# Patient Record
Sex: Female | Born: 1970 | Race: White | Hispanic: No | Marital: Married | Smoking: Never smoker
Health system: Southern US, Community
[De-identification: ages and names within clinical notes are randomized; demographics above are authoritative.]

## PROBLEM LIST (undated history)

## (undated) DIAGNOSIS — A0472 Enterocolitis due to Clostridium difficile, not specified as recurrent: Secondary | ICD-10-CM

## (undated) DIAGNOSIS — E538 Deficiency of other specified B group vitamins: Secondary | ICD-10-CM

## (undated) DIAGNOSIS — J45909 Unspecified asthma, uncomplicated: Secondary | ICD-10-CM

## (undated) DIAGNOSIS — N3010 Interstitial cystitis (chronic) without hematuria: Secondary | ICD-10-CM

## (undated) DIAGNOSIS — J302 Other seasonal allergic rhinitis: Secondary | ICD-10-CM

## (undated) DIAGNOSIS — K649 Unspecified hemorrhoids: Secondary | ICD-10-CM

## (undated) DIAGNOSIS — D649 Anemia, unspecified: Secondary | ICD-10-CM

## (undated) DIAGNOSIS — K589 Irritable bowel syndrome without diarrhea: Secondary | ICD-10-CM

## (undated) HISTORY — PX: DILATION AND CURETTAGE OF UTERUS: SHX78

## (undated) HISTORY — DX: Interstitial cystitis (chronic) without hematuria: N30.10

## (undated) HISTORY — PX: TUBAL LIGATION: SHX77

## (undated) HISTORY — DX: Unspecified asthma, uncomplicated: J45.909

## (undated) HISTORY — DX: Enterocolitis due to Clostridium difficile, not specified as recurrent: A04.72

## (undated) HISTORY — PX: OTHER SURGICAL HISTORY: SHX169

## (undated) HISTORY — DX: Unspecified hemorrhoids: K64.9

## (undated) HISTORY — DX: Irritable bowel syndrome, unspecified: K58.9

## (undated) HISTORY — DX: Deficiency of other specified B group vitamins: E53.8

---

## 2004-08-03 ENCOUNTER — Other Ambulatory Visit: Admission: RE | Admit: 2004-08-03 | Discharge: 2004-08-03 | Payer: Self-pay | Admitting: Obstetrics and Gynecology

## 2004-08-04 ENCOUNTER — Other Ambulatory Visit: Admission: RE | Admit: 2004-08-04 | Discharge: 2004-08-04 | Payer: Self-pay | Admitting: Obstetrics and Gynecology

## 2004-08-24 ENCOUNTER — Ambulatory Visit: Payer: Self-pay | Admitting: Internal Medicine

## 2005-04-04 ENCOUNTER — Inpatient Hospital Stay (HOSPITAL_COMMUNITY): Admission: AD | Admit: 2005-04-04 | Discharge: 2005-04-07 | Payer: Self-pay | Admitting: Obstetrics and Gynecology

## 2005-04-08 ENCOUNTER — Encounter: Admission: RE | Admit: 2005-04-08 | Discharge: 2005-05-08 | Payer: Self-pay | Admitting: Obstetrics and Gynecology

## 2005-05-09 ENCOUNTER — Encounter: Admission: RE | Admit: 2005-05-09 | Discharge: 2005-06-05 | Payer: Self-pay | Admitting: Obstetrics and Gynecology

## 2006-01-09 ENCOUNTER — Ambulatory Visit: Payer: Self-pay | Admitting: Internal Medicine

## 2006-04-26 ENCOUNTER — Ambulatory Visit: Payer: Self-pay | Admitting: Internal Medicine

## 2006-09-10 ENCOUNTER — Ambulatory Visit: Payer: Self-pay | Admitting: Internal Medicine

## 2006-10-23 ENCOUNTER — Ambulatory Visit: Payer: Self-pay | Admitting: Internal Medicine

## 2006-11-19 DIAGNOSIS — J45909 Unspecified asthma, uncomplicated: Secondary | ICD-10-CM | POA: Insufficient documentation

## 2007-03-07 ENCOUNTER — Telehealth: Payer: Self-pay | Admitting: Internal Medicine

## 2007-08-27 ENCOUNTER — Telehealth: Payer: Self-pay | Admitting: Internal Medicine

## 2007-11-20 ENCOUNTER — Ambulatory Visit (HOSPITAL_COMMUNITY): Admission: RE | Admit: 2007-11-20 | Discharge: 2007-11-20 | Payer: Self-pay | Admitting: Obstetrics and Gynecology

## 2007-11-20 ENCOUNTER — Encounter (INDEPENDENT_AMBULATORY_CARE_PROVIDER_SITE_OTHER): Payer: Self-pay | Admitting: Obstetrics and Gynecology

## 2007-12-22 ENCOUNTER — Ambulatory Visit: Payer: Self-pay | Admitting: Internal Medicine

## 2007-12-22 DIAGNOSIS — H6500 Acute serous otitis media, unspecified ear: Secondary | ICD-10-CM | POA: Insufficient documentation

## 2007-12-22 DIAGNOSIS — H60339 Swimmer's ear, unspecified ear: Secondary | ICD-10-CM | POA: Insufficient documentation

## 2007-12-29 ENCOUNTER — Telehealth: Payer: Self-pay | Admitting: Internal Medicine

## 2008-02-12 ENCOUNTER — Telehealth: Payer: Self-pay | Admitting: *Deleted

## 2008-03-09 ENCOUNTER — Ambulatory Visit: Payer: Self-pay | Admitting: Internal Medicine

## 2008-03-09 DIAGNOSIS — D179 Benign lipomatous neoplasm, unspecified: Secondary | ICD-10-CM | POA: Insufficient documentation

## 2008-03-23 ENCOUNTER — Telehealth: Payer: Self-pay | Admitting: Internal Medicine

## 2008-03-29 ENCOUNTER — Ambulatory Visit: Payer: Self-pay | Admitting: Internal Medicine

## 2008-04-01 ENCOUNTER — Telehealth: Payer: Self-pay | Admitting: Internal Medicine

## 2008-04-05 ENCOUNTER — Ambulatory Visit: Payer: Self-pay | Admitting: Internal Medicine

## 2008-10-22 ENCOUNTER — Ambulatory Visit: Payer: Self-pay | Admitting: Internal Medicine

## 2009-07-14 ENCOUNTER — Encounter (INDEPENDENT_AMBULATORY_CARE_PROVIDER_SITE_OTHER): Payer: Self-pay | Admitting: Obstetrics and Gynecology

## 2009-07-14 ENCOUNTER — Inpatient Hospital Stay (HOSPITAL_COMMUNITY): Admission: RE | Admit: 2009-07-14 | Discharge: 2009-07-16 | Payer: Self-pay | Admitting: Obstetrics and Gynecology

## 2009-07-17 ENCOUNTER — Encounter: Admission: RE | Admit: 2009-07-17 | Discharge: 2009-08-16 | Payer: Self-pay | Admitting: Obstetrics and Gynecology

## 2009-08-17 ENCOUNTER — Encounter: Admission: RE | Admit: 2009-08-17 | Discharge: 2009-09-16 | Payer: Self-pay | Admitting: Obstetrics and Gynecology

## 2009-08-30 ENCOUNTER — Encounter: Payer: Self-pay | Admitting: Cardiovascular Disease

## 2009-09-17 ENCOUNTER — Encounter: Admission: RE | Admit: 2009-09-17 | Discharge: 2009-10-10 | Payer: Self-pay | Admitting: Obstetrics and Gynecology

## 2009-11-14 ENCOUNTER — Telehealth: Payer: Self-pay | Admitting: Internal Medicine

## 2009-11-28 ENCOUNTER — Ambulatory Visit: Payer: Self-pay | Admitting: Internal Medicine

## 2009-11-29 ENCOUNTER — Encounter: Payer: Self-pay | Admitting: Internal Medicine

## 2009-12-05 ENCOUNTER — Telehealth: Payer: Self-pay | Admitting: Internal Medicine

## 2009-12-07 ENCOUNTER — Telehealth: Payer: Self-pay | Admitting: Internal Medicine

## 2009-12-12 ENCOUNTER — Ambulatory Visit: Payer: Self-pay | Admitting: Internal Medicine

## 2009-12-12 ENCOUNTER — Encounter: Payer: Self-pay | Admitting: Gastroenterology

## 2009-12-12 DIAGNOSIS — R197 Diarrhea, unspecified: Secondary | ICD-10-CM | POA: Insufficient documentation

## 2009-12-12 DIAGNOSIS — Z8719 Personal history of other diseases of the digestive system: Secondary | ICD-10-CM | POA: Insufficient documentation

## 2009-12-12 LAB — CONVERTED CEMR LAB
Basophils Absolute: 0 10*3/uL (ref 0.0–0.1)
Basophils Relative: 0.8 % (ref 0.0–3.0)
Eosinophils Absolute: 0.1 10*3/uL (ref 0.0–0.7)
Eosinophils Relative: 2.3 % (ref 0.0–5.0)
HCT: 42.2 % (ref 36.0–46.0)
Hemoglobin: 14.5 g/dL (ref 12.0–15.0)
Lymphocytes Relative: 37 % (ref 12.0–46.0)
Lymphs Abs: 1.7 10*3/uL (ref 0.7–4.0)
MCHC: 34.3 g/dL (ref 30.0–36.0)
MCV: 89.9 fL (ref 78.0–100.0)
Monocytes Absolute: 0.4 10*3/uL (ref 0.1–1.0)
Monocytes Relative: 8.1 % (ref 3.0–12.0)
Neutro Abs: 2.4 10*3/uL (ref 1.4–7.7)
Neutrophils Relative %: 51.8 % (ref 43.0–77.0)
Platelets: 199 10*3/uL (ref 150.0–400.0)
RBC: 4.69 M/uL (ref 3.87–5.11)
RDW: 12.7 % (ref 11.5–14.6)
WBC: 4.6 10*3/uL (ref 4.5–10.5)

## 2009-12-14 ENCOUNTER — Telehealth: Payer: Self-pay | Admitting: Nurse Practitioner

## 2009-12-15 ENCOUNTER — Encounter (INDEPENDENT_AMBULATORY_CARE_PROVIDER_SITE_OTHER): Payer: Self-pay | Admitting: *Deleted

## 2009-12-15 ENCOUNTER — Ambulatory Visit: Payer: Self-pay | Admitting: Internal Medicine

## 2009-12-16 ENCOUNTER — Encounter: Payer: Self-pay | Admitting: Internal Medicine

## 2009-12-18 ENCOUNTER — Telehealth: Payer: Self-pay | Admitting: Gastroenterology

## 2009-12-19 ENCOUNTER — Encounter: Payer: Self-pay | Admitting: Internal Medicine

## 2009-12-22 ENCOUNTER — Telehealth: Payer: Self-pay | Admitting: Internal Medicine

## 2009-12-27 ENCOUNTER — Telehealth: Payer: Self-pay | Admitting: Internal Medicine

## 2009-12-27 DIAGNOSIS — K589 Irritable bowel syndrome without diarrhea: Secondary | ICD-10-CM | POA: Insufficient documentation

## 2010-02-15 ENCOUNTER — Ambulatory Visit (HOSPITAL_COMMUNITY)
Admission: RE | Admit: 2010-02-15 | Discharge: 2010-02-15 | Payer: Self-pay | Source: Home / Self Care | Admitting: Internal Medicine

## 2010-02-15 ENCOUNTER — Ambulatory Visit: Payer: Self-pay | Admitting: Internal Medicine

## 2010-02-15 DIAGNOSIS — K3189 Other diseases of stomach and duodenum: Secondary | ICD-10-CM | POA: Insufficient documentation

## 2010-02-15 DIAGNOSIS — R1013 Epigastric pain: Secondary | ICD-10-CM

## 2010-02-15 LAB — CONVERTED CEMR LAB: Tissue Transglutaminase Ab, IgA: 2.5 units (ref <20)

## 2010-03-06 ENCOUNTER — Telehealth: Payer: Self-pay | Admitting: Internal Medicine

## 2010-05-18 ENCOUNTER — Other Ambulatory Visit: Payer: Self-pay | Admitting: Dermatology

## 2010-05-29 ENCOUNTER — Telehealth: Payer: Self-pay | Admitting: Internal Medicine

## 2010-05-30 NOTE — Miscellaneous (Signed)
Summary: Bentyl, Flagyl rx  Clinical Lists Changes  Medications: Added new medication of FLAGYL 250 MG TABS (METRONIDAZOLE) take three daily by mouth for 10 days - Signed Rx of FLAGYL 250 MG TABS (METRONIDAZOLE) take three daily by mouth for 10 days;  #30 x 0;  Signed;  Entered by: Oda Cogan RN;  Authorized by: Hart Carwin MD;  Method used: Electronically to [id number]*, [address], [place], [place], [place]  [address], Ph: [phone] or [phone], Fax: [phone]    Prescriptions: FLAGYL 250 MG TABS (METRONIDAZOLE) take three daily by mouth for 10 days  #30 x 0   Entered by:   Oda Cogan RN   Authorized by:   Hart Carwin MD   Signed by:   Oda Cogan RN on 12/15/2009   Method used:   Electronically to        [facility] [phone]* (retail)       [address]       [place], [place]  [address]       Ph: [phone] or [phone]       Fax: [phone]   RxID:   [id number]

## 2010-05-30 NOTE — Assessment & Plan Note (Signed)
Summary: follow up colon/sheri   History of Present Illness Visit Type: Follow-up Visit Primary GI MD: [name] MD Primary Provider: Birdie Sons, MD Chief Complaint: Patient not sure that c-diff is gone, she has some good days and bad days History of Present Illness:   This is an 40 year old white female with irritable bowel syndrome/diarrhea. She was diagnosed with pseudomembranous colitis while visiting [place] this past summer. A colonoscopy in August 2011 did not show any evidence of colitis. Her brother has Crohn's disease. She still has upper abdominal discomfort ;especially left upper quadrant and she also continues to have diarrhea. She denies lactose intolerance. Her 56 month old daughter has failure to thrive and has been put on Prilosec 20 mg daily for regurgitation of food due to GERD   GI Review of Systems    Reports abdominal pain.     Location of  Abdominal pain: upper abdomen.    Denies acid reflux, belching, bloating, chest pain, dysphagia with liquids, dysphagia with solids, heartburn, loss of appetite, nausea, vomiting, vomiting blood, weight loss, and  weight gain.      Reports diarrhea.     Denies anal fissure, black tarry stools, change in bowel habit, constipation, diverticulosis, fecal incontinence, heme positive stool, hemorrhoids, irritable bowel syndrome, jaundice, light color stool, liver problems, rectal bleeding, and  rectal pain.    Current Medications (verified): 1)  Zyrtec Allergy 10 Mg Tabs (Cetirizine Hcl) .... Once Daily  Allergies (verified): No Known Drug Allergies  Past History:  Past Medical History: Reviewed history from 12/12/2009 and no changes required. allergy induced asthma Asthma Dil. and curretage 3 weeks ago for Uterine polyps Arrhythmia Irritable Bowel Syndrome Interstitial Cystitis  Past Surgical History: Reviewed history from 12/27/2009 and no changes required. D&C 3 weeks ago C- section x 2 Resection of endometrial  polyp bilateral tubal sterilization  Family History: Reviewed history from 12/27/2009 and no changes required. Crohns---brother bladder CA, father Family History of Irritable Bowel Syndrome: No FH of Colon Cancer:  Social History: Reviewed history from 12/12/2009 and no changes required. Occupation: Unemployed Patient has never smoked.  Alcohol Use - yes Daily Caffeine Use Illicit Drug Use - no  Review of Systems       The patient complains of allergy/sinus.  The patient denies anemia, anxiety-new, arthritis/joint pain, back pain, blood in urine, breast changes/lumps, change in vision, confusion, cough, coughing up blood, depression-new, fainting, fatigue, fever, headaches-new, hearing problems, heart murmur, heart rhythm changes, itching, menstrual pain, muscle pains/cramps, night sweats, nosebleeds, pregnancy symptoms, shortness of breath, skin rash, sleeping problems, sore throat, swelling of feet/legs, swollen lymph glands, thirst - excessive , urination - excessive , urination changes/pain, urine leakage, vision changes, and voice change.         Pertinent positive and negative review of systems were noted in the above HPI. All other ROS was otherwise negative.   Vital Signs:  Patient profile:   40 year old female Height:      64 inches Weight:      141.50 pounds BMI:     24.38 Pulse rate:   72 / minute Pulse rhythm:   regular BP sitting:   110 / 72  (left arm) Cuff size:   regular  Vitals Entered By: June McMurray CMA Duncan Dull) (February 15, 2010 10:11 AM)  Physical Exam  General:  Well developed, well nourished, no acute distress. Eyes:  PERRLA, no icterus. Lungs:  Clear throughout to auscultation. Heart:  Regular rate and rhythm; no murmurs,  rubs,  or bruits. Abdomen:  soft abdomen there is no active bowel sounds. No distention. Tenderness in epigastrium and left costal margin. Extremities:  No clubbing, cyanosis, edema or deformities noted. Skin:  Intact without  significant lesions or rashes. Psych:  Alert and cooperative. Normal mood and affect.   Impression & Recommendations:  Problem # 1:  IRRITABLE BOWEL SYNDROME (ICD-564.1) Patient has irritable bowel syndrome. We need to rule out pseudomembranous colitis, gastritis, biliary dysfunction. We will obtain stool tests for C. difficile by PCR. We will also obtain a sprue profile and IgA levels and also schedule patient for an upper abdominal ultrasound. We will start her on Bentyl 20 mg q.a.m. and give her samples of AcipHex 20 mg a day for the next few days on trial basis.  Problem # 2:  Hx of CLOSTRIDIUM DIFFICILE COLITIS, HX OF (ICD-V12.79) We will obtain stool for C. difficile toxin by PCR.  Orders: T-Tissue Transglutamase Ab IgA [phone]) T- * Misc. Laboratory test [phone]) T-Sprue Panel (Celiac Disease Aby Eval) (83516x3/[id number]) T- * Misc. Laboratory test [phone]) TLB-IgA (Immunoglobulin A) ([id number])  Other Orders: Ultrasound Abdomen (UAS)  Patient Instructions: 1)  stool test for C. difficile toxin by PCR. 2)  Sprue profile including tissue transglutaminase and IgA. 3)  Bentyl 20 mg p.o. q.a.m. 4)  Samples of AcipHex 20 mg q.a.m. x 10 days 5)  The medication list was reviewed and reconciled.  All changed / newly prescribed medications were explained.  A complete medication list was provided to the patient / caregiver. 6)  Br Bruce [name] Prescriptions: BENTYL 20 MG TABS (DICYCLOMINE HCL) Take 1 tablet by mouth every morning  #30 x 1   Entered by:   Lamona Curl CMA (AAMA)   Authorized by:   Hart Carwin MD   Signed by:   Lamona Curl CMA (AAMA) on 02/15/2010   Method used:   Electronically to        [facility] [phone]* (retail)       [address]       [place], [place]  [address]       Ph: [phone] or [phone]       Fax: [phone]   RxID:   [id number]

## 2010-05-30 NOTE — Progress Notes (Signed)
Summary: diarrhea x 2 weeks.  Phone Note Call from Patient   Caller: Patient Call For: Birdie Sons MD Summary of Call: [phone] Pt is out of town, and is complaining of diarrhea x 2 weeks while traveling.  Advised to see Urgent Care for possible cultures and tesing while on extended leave out of town. Initial call taken by: Lynann Beaver CMA,  November 14, 2009 9:23 AM

## 2010-05-30 NOTE — Procedures (Signed)
Summary: Colonoscopy  Patient: [name] Note: All result statuses are Final unless otherwise noted.  Tests: (1) Colonoscopy (COL)   COL Colonoscopy           DONE     [facility]     [address]     [place], [place]  [address]           COLONOSCOPY PROCEDURE REPORT           PATIENT:  Kari Stephens, Kari Stephens  MR#:  [id number]     BIRTHDATE:  Jan 24, 1971, 39 yrs. old  GENDER:  female     ENDOSCOPIST:  Hedwig Morton. Juanda Chance, MD     REF. BY:  Birdie Sons, M.D.     PROCEDURE DATE:  12/15/2009     PROCEDURE:  Colonoscopy 19147     ASA CLASS:  Class I     INDICATIONS:  unexplained diarrhea hx IBS     C.Diff diagnosed in [place], treated with     Flagylx10days without improvement     brother with Crohn's disease     MEDICATIONS:   Versed 10 mg, Fentanyl 100 mcg           DESCRIPTION OF PROCEDURE:   After the risks benefits and     alternatives of the procedure were thoroughly explained, informed     consent was obtained.  Digital rectal exam was performed and     revealed no rectal masses.   The LB [id number] endoscope     was introduced through the anus and advanced to the terminal ileum     which was intubated for a short distance, without limitations.     The quality of the prep was good, using MiraLax.  The instrument     was then slowly withdrawn as the colon was fully examined.     <<PROCEDUREIMAGES>>           FINDINGS:  No polyps or cancers were seen. normal appearing colon     mucosa, no aphthous ulcers, ileocecal valve normal, random     biopsies to r/o microscopic colitis Random biopsies were obtained     and sent to pathology (see image1, image2, and image3).  The     terminal ileum appeared normal. With standard forceps, biopsy was     obtained and sent to pathology.  normal rectum (see image4).     Retroflexed views in the rectum revealed no abnormalities.    The     scope was then withdrawn from the patient and the procedure     completed.          COMPLICATIONS:  None     ENDOSCOPIC IMPRESSION:     1) No polyps or cancers     2) Normal terminal ileum     3) Normal rectum     4) Normal colonoscopy     s/p random biopsies of the colon and of the TI     liquid stool odor similar to C.Diff     RECOMMENDATIONS:     1) Await biopsy results     would treat empirically for 10 more days with Flagyl because of     the stool odor, also continue probiotics, 250 mg po tid x 10 d,     #30, no refill     Bentyl 10 mg po bid.# 20, 1 refill     REPEAT EXAM:  In 12 year(s) for.  age 31  ______________________________     Hedwig Morton. Juanda Chance, MD           CC:           n.     eSIGNED:   Hedwig Morton. Finbar Nippert at 12/15/2009 05:25 PM           Dwain Sarna, [id number]  Note: An exclamation mark (!) indicates a result that was not dispersed into the flowsheet. Document Creation Date: 12/15/2009 5:26 PM _______________________________________________________________________  (1) Order result status: Final Collection or observation date-time: 12/15/2009 17:10 Requested date-time:  Receipt date-time:  Reported date-time:  Referring Physician:   Ordering Physician: [name] [phone]) Specimen Source:  Source: Launa Grill Order Number: [id number] Lab site:

## 2010-05-30 NOTE — Progress Notes (Signed)
Summary: was told to call Dr Cato Mulligan  Phone Note Call from Patient Call back at [phone]   Caller: Patient---live call Summary of Call: Dr Cato Mulligan told patient to call if not better. She isn't better. please advise. was seen a week ago. Initial call taken by: Warnell Forester,  December 05, 2009 8:08 AM  Follow-up for Phone Call        lets have her see Gi likely needs colonoscopy GI appt this week---ok to see PA first Follow-up by: Birdie Sons MD,  December 05, 2009 10:41 AM  Additional Follow-up for Phone Call Additional follow up Details #1::        Patient notified. Will await when and where of appt.  Requests Dr Juanda Chance if possible, but aware Dr Cato Mulligan would like her seen this week.  Order in process. Additional Follow-up by: Gladis Riffle, RN,  December 05, 2009 12:25 PM

## 2010-05-30 NOTE — Letter (Signed)
Summary: [facility] Instructions  [facility]  [address] [place], [place] [address]   Phone: [phone]  Fax: [phone]       Kari Stephens    [birth year]    MRN: [id number]       Procedure Day /Date:THURSDAY 12/15/2009     Arrival Time: 3PM     Procedure Time:4PM     Location of Procedure:                    x   Endoscopy Center (4th Floor)    PREPARATION FOR COLONOSCOPY WITH MIRALAX  Starting 5 days prior to your procedure 8/13/2011do not eat nuts, seeds, popcorn, corn, beans, peas,  salads, or any raw vegetables.  Do not take any fiber supplements (e.g. Metamucil, Citrucel, and Benefiber). ____________________________________________________________________________________________________   THE DAY BEFORE YOUR PROCEDURE         DATE:12/14/2009 DAY: WEDNESDAY  1   Drink clear liquids the entire day-NO SOLID FOOD  2   Do not drink anything colored red or purple.  Avoid juices with pulp.  No orange juice.  3   Drink at least 64 oz. (8 glasses) of fluid/clear liquids during the day to prevent dehydration and help the prep work efficiently.  CLEAR LIQUIDS INCLUDE: Water Jello Ice Popsicles Tea (sugar ok, no milk/cream) Powdered fruit flavored drinks Coffee (sugar ok, no milk/cream) Gatorade Juice: apple, white grape, white cranberry  Lemonade Clear bullion, consomm, broth Carbonated beverages (any kind) Strained chicken noodle soup Hard Candy  4   Mix the entire bottle of Miralax with 64 oz. of Gatorade/Powerade in the morning and put in the refrigerator to chill.  5   At 3:00 pm take 2 Dulcolax/Bisacodyl tablets.  6   At 4:30 pm take one Reglan/Metoclopramide tablet.  7  Starting at 5:00 pm drink one 8 oz glass of the Miralax mixture every 15-20 minutes until you have finished drinking the entire 64 oz.  You should finish drinking prep around 7:30 or 8:00 pm.  8   If you are nauseated, you may take the 2nd Reglan/Metoclopramide tablet at  6:30 pm.        9    At 8:00 pm take 2 more DULCOLAX/Bisacodyl tablets.     THE DAY OF YOUR PROCEDURE      DATE:  12/15/2009 DAY: Lenor Coffin  You may drink clear liquids until 2PM (2 HOURS BEFORE PROCEDURE).   MEDICATION INSTRUCTIONS  Unless otherwise instructed, you should take regular prescription medications with a small sip of water as early as possible the morning of your procedure.           OTHER INSTRUCTIONS  You will need a responsible adult at least 39 years of age to accompany you and drive you home.   This person must remain in the waiting room during your procedure.  Wear loose fitting clothing that is easily removed.  Leave jewelry and other valuables at home.  However, you may wish to bring a book to read or an iPod/MP3 player to listen to music as you wait for your procedure to start.  Remove all body piercing jewelry and leave at home.  Total time from sign-in until discharge is approximately 2-3 hours.  You should go home directly after your procedure and rest.  You can resume normal activities the day after your procedure.  The day of your procedure you should not:   Drive   Make legal decisions   Operate machinery  Drink alcohol   Return to work  You will receive specific instructions about eating, activities and medications before you leave.   The above instructions have been reviewed and explained to me by   _______________________    I fully understand and can verbalize these instructions _____________________________ Date _______

## 2010-05-30 NOTE — Assessment & Plan Note (Signed)
Summary: Diarrhea/dfs   History of Present Illness Visit Type: Initial Visit Primary GI MD: [name] MD Primary Provider: Birdie Sons, MD Chief Complaint: chronic diarrhea, sour stomach History of Present Illness:   Patient is a 40 year old female, new to GI, who presents with diarrhea. Mid March patient had C-section. Toward end of pregnancy she had some mild diarrhea but attributes that to hormones and a lot going on at the time.  She remembers discussing problem of excessive flatus with GYN at post-op visit.  She did have antibiotics at time of C-section in March but no other antibiotics that she can recall.  Patient has  chronic history of intermittent loose stools, mainly in am. Told at some point in time she may have IBS. No formal GI workup. In July things worsened significantly. She was in [place] at the time, went to urgent care and diagnosed with C-Difficile. Completed 10 days of Flagyl almost two weeks ago but BMs still not back to baseline.   She is taking probiotics. No rectal bleeding except for hemorrhoidal bleeding during pregnancy. Currently no fevers. Weight has dropped but eating cautiously because scared of diarrhea. No nocturnal diarrhea.No skin rashes, no joint pain.  Her brother has terrible Crohn 's disease.There are a lot "weird stomach issues in the family"   GI Review of Systems    Reports belching, bloating, and  nausea.      Denies abdominal pain, chest pain, dysphagia with liquids, dysphagia with solids, heartburn, loss of appetite, vomiting, vomiting blood, weight loss, and  weight gain.      Reports change in bowel habits, diarrhea, irritable bowel syndrome, and  rectal pain.     Denies anal fissure, black tarry stools, constipation, diverticulosis, fecal incontinence, heme positive stool, hemorrhoids, jaundice, light color stool, liver problems, and  rectal bleeding. Preventive Screening-Counseling & Management  Alcohol-Tobacco     Smoking Status:  never      Drug Use:  no.      Current Medications (verified): 1)  Zyrtec Allergy 10 Mg Tabs (Cetirizine Hcl) .... Once Daily 2)  Align  Caps (Probiotic Product) .... Once Daily  Allergies (verified): No Known Drug Allergies  Past History:  Past Medical History: allergy induced asthma Asthma Dil. and curretage 3 weeks ago for Uterine polyps Arrhythmia Irritable Bowel Syndrome Interstitial Cystitis  Past Surgical History: D& C 3 weeks ago C- section x2  Family History: Crohns---brother bladder CA, father Family History of Irritable Bowel Syndrome:  Social History: Occupation: Unemployed Patient has never smoked.  Alcohol Use - yes Daily Caffeine Use Illicit Drug Use - no  Review of Systems       The patient complains of allergy/sinus, fatigue, heart rhythm changes, and skin rash.  The patient denies anemia, anxiety-new, arthritis/joint pain, back pain, blood in urine, breast changes/lumps, change in vision, confusion, cough, coughing up blood, depression-new, fainting, fever, headaches-new, hearing problems, heart murmur, itching, menstrual pain, muscle pains/cramps, night sweats, nosebleeds, pregnancy symptoms, shortness of breath, sleeping problems, sore throat, swelling of feet/legs, swollen lymph glands, thirst - excessive, urination - excessive, urination changes/pain, urine leakage, vision changes, and voice change.    Vital Signs:  Patient profile:   40 year old female Height:      64 inches Weight:      141.50 pounds BMI:     24.38 Pulse rate:   68 / minute Pulse rhythm:   regular BP sitting:   102 / 70  (left arm) Cuff size:  regular  Vitals Entered By: June McMurray CMA Duncan Dull) (December 12, 2009 3:38 PM)  Physical Exam  General:  Well developed, well nourished, no acute distress. Head:  Normocephalic and atraumatic. Eyes:  Conjunctiva pink, no icterus.  Mouth:  No oral lesions. Tongue moist.  Neck:  no obvious masses  Lungs:  Clear throughout  to auscultation. Heart:  Regular rate and rhythm; no murmurs, rubs,  or bruits. Abdomen:  Abdomen soft, nontender, nondistended. No obvious masses or hepatomegaly.Normal bowel sounds.  Msk:  Symmetrical with no gross deformities. Normal posture. Extremities:  No palmar erythema, no edema.  Neurologic:  Alert and  oriented x4;  grossly normal neurologically. Skin:  Intact without significant lesions or rashes. Cervical Nodes:  No significant cervical adenopathy. Psych:  Alert and cooperative. Normal mood and affect.   Impression & Recommendations:  Problem # 1:  DIARRHEA (ICD-787.91) Assessment New C-Difficile in July, only risk factor as far as I can tell is antibiotic use but that was back in mid March. Completed Flagyl almost two weeks ago. Bowels still not back to baseline.  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 It is possible she has underlying IBD, which is risk factor for C-Diff, but low liklihood. Brother with Crohn's disease. For further evaluation patient will be schedule for a colonoscopy with biopsies/polypectomy (if indicated).  The risks and benefits of the procedure, as well as alternatives were discussed with the patient and she agrees to proceed.  Orders: TLB-CBC Platelet - w/Differential ([id number]) Colonoscopy (Colon)  Patient Instructions: 1)  Copy sent to : Birdie Sons, MD 2)  Your Colonoscopy is scheduled for 12/15/2009 at 4pm 3)  You can pick up your Miralax prep from your pharmacy today 4)  The medication list was reviewed and reconciled.  All changed / newly prescribed medications were explained.  A complete medication list was provided to the patient / caregiver. Prescriptions: DULCOLAX 5 MG  TBEC (BISACODYL) Day before procedure take 2 at 3pm and 2 at 8pm.  #4 x 0   Entered by:   Merri Ray CMA (AAMA)   Authorized by:   Willette Cluster NP   Signed by:   Merri Ray CMA (AAMA) on 12/12/2009   Method used:   Electronically to         [facility] [phone]* (retail)       [address]       [place], [place]  [address]       Ph: [phone] or [phone]       Fax: [phone]   RxID:   [id number] REGLAN 10 MG  TABS (METOCLOPRAMIDE HCL) As per prep instructions.  #2 x 0   Entered by:   Merri Ray CMA (AAMA)   Authorized by:   Willette Cluster NP   Signed by:   Merri Ray CMA (AAMA) on 12/12/2009   Method used:   Electronically to        [facility] [phone]* (retail)       [address]       [place], [place]  [address]       Ph: [phone] or [phone]       Fax: [phone]   RxID:   [id number] MIRALAX   POWD (POLYETHYLENE GLYCOL 3350) As per prep  instructions.  #255gm x 0   Entered by:   Merri Ray CMA (AAMA)  Authorized by:   Willette Cluster NP   Signed by:   Merri Ray CMA (AAMA) on 12/12/2009   Method used:   Electronically to        [facility] [phone]* (retail)       [address]       [place], [place]  [address]       Ph: [phone] or [phone]       Fax: [phone]   RxID:   [id number]

## 2010-05-30 NOTE — Progress Notes (Signed)
Summary: ON CALL NOTE                             [facility]                                    ON-CALL NOTE      SHINE, SCROGHAM                     MRN:          [id number]   DATE:12/18/2009                            DOB:          1970/10/31         Ms. Sanko called complaining of back pain.  She had a colonoscopy last   week.  Pain is worse when she turns and twists.  She has not had a   problem with back pain in the past.  She denies abdominal pain.      I explained to her that this sounds like musculoskeletal pain and then   she can take ibuprofen or any other nonsteroidal as needed for the pain.            Barbette Hair. Arlyce Dice, MD,FACG            RDK/MedQ  DD: 12/18/2009  DT: 12/19/2009  Job #: [id number]      cc:   Hedwig Morton. Juanda Chance, MD

## 2010-05-30 NOTE — Progress Notes (Signed)
Summary: Appt this wk PA first ok  Phone Note From Other Clinic   Caller: Terri @ Dr Cato Mulligan Call For: Dr Juanda Chance ( Doc Of Day) Reason for Call: Schedule Patient Appt Summary of Call: Clostridium Dificile Colilitis evaluate & treat. Would like patient seen this week PA first ok. Initial call taken by: Leanor Kail [facility],  December 07, 2009 8:09 AM  Follow-up for Phone Call        LM for Terri to call re appt.  Lupita Leash Surface RN  December 07, 2009 8:58 AM  talked with pt.  Appt sch for pt to see Rozetta Nunnery NP on 12/12/09, Dr. Juanda Chance is supervising physician on this day.  Pt states it is OK to wait until Monday. Dr. Marliss Coots office notified. Follow-up by: Ashok Cordia RN,  December 07, 2009 9:23 AM

## 2010-05-30 NOTE — Progress Notes (Signed)
Summary: results request  Phone Note Call from Patient Call back at [phone]   Caller: Patient Call For: Willette Cluster Reason for Call: Talk to Nurse Summary of Call: would like lab results Initial call taken by: Vallarie Mare,  December 14, 2009 3:00 PM  Follow-up for Phone Call        Gave pt her lab results, normal.   Follow-up by: Joselyn Glassman,  December 15, 2009 9:18 AM

## 2010-05-30 NOTE — Assessment & Plan Note (Signed)
Summary: F/U FROM C-DIFF DX // RS   Vital Signs:  Patient profile:   40 year old female Weight:      145 pounds Temp:     98.8 degrees F oral Pulse rate:   64 / minute Pulse rhythm:   regular Resp:     12 per minute BP sitting:   114 / 76  (left arm) Cuff size:   regular  Vitals Entered By: Gladis Riffle, RN (November 28, 2009 11:16 AM) CC: FU, was diagnosed with C-diff in NH and completed 10 day course of metronidazole --continues "stomach issues" Is Patient Diabetic? No   CC:  FU and was diagnosed with C-diff in NH and completed 10 day course of metronidazole --continues "stomach issues".  History of Present Illness: c section 4 months ago had a chronic upset stomach developed diarrhea was out of town, went to [facility]--dx with C Diff she recently had her first period after pregnancy  Also daughter 9 months old had C diff pt denies recent ABX use (had abx at time of C section)  Fhx of Crohns disease (brother)  All other systems reviewed and were negative   Preventive Screening-Counseling & Management  Alcohol-Tobacco     Smoking Status: never     Passive Smoke Exposure: no  Allergies (verified): No Known Drug Allergies  Past History:  Past Medical History: Last updated: 12/22/2007 allergy induced asthma Asthma Dil. and curretage 3 weeks ago for Uterine polyps  Family History: Last updated: 11/28/2009 Crohns---brother bladder CA, father  Risk Factors: Exercise: yes (12/22/2007)  Risk Factors: Smoking Status: never (11/28/2009) Passive Smoke Exposure: no (11/28/2009)  Family History: Crohns---brother bladder CA, father  Review of Systems       All other systems reviewed and were negative   Physical Exam  General:  Well-developed,well-nourished,in no acute distress; alert,appropriate and cooperative throughout examination Head:  normocephalic and atraumatic.   Eyes:  pupils equal and pupils round.   Ears:  R ear normal and L ear normal.   Neck:  No  deformities, masses, or tenderness noted. Lungs:  Normal respiratory effort, chest expands symmetrically. Lungs are clear to auscultation, no crackles or wheezes. Heart:  normal rate and regular rhythm.   Abdomen:  soft and non-tender.   Msk:  No deformity or scoliosis noted of thoracic or lumbar spine.   Neurologic:  cranial nerves II-XII intact and gait normal.     Impression & Recommendations:  Problem # 1:  CLOSTRIDIUM DIFFICILE COLITIS (ICD-008.45)  resolved completed ABX she has occasional loose, recheck c diff toxin if negative will consider probiotic  Orders: T-Clostridium difficile Toxin A/B ([id number])  Complete Medication List: 1)  Zyrtec Allergy 10 Mg Tabs (Cetirizine hcl) .... Once daily 2)  Imiquimod 5 % Crea (Imiquimod) .... Apply nightly x 6 weeks 3)  Align Caps (Probiotic product) .... Take per pkg instructions x 2 weeks

## 2010-05-30 NOTE — Letter (Signed)
Summary: Appt Reminder 2  [facility]  [address] [place], [place] [address]   Phone: [phone]  Fax: [phone]        December 16, 2009 MRN: [id number]    [facility] [address] [place], [place]  [address]    Dear Ms. Dreibelbis,   You have a return appointment with Dr. Juanda Chance on 01/06/10 at 8:45 am.  Please remember to bring a complete list of the medicines you are taking, your insurance card and your co-pay.  If you have to cancel or reschedule this appointment, please call before 5:00 pm the evening before to avoid a cancellation fee.  If you have any questions or concerns, please call [phone].    Sincerely,    Darcey Nora RN, CGRN  Appended Document: Appt Reminder 2 letter mailed to patient's home

## 2010-05-30 NOTE — Progress Notes (Signed)
Summary: Question about her labs  Phone Note Call from Patient Call back at [phone]   Call For: Dr Juanda Chance Reason for Call: Talk to Nurse Summary of Call: Question about her labs.   Initial call taken by: Leanor Kail [facility],  December 27, 2009 1:39 PM  Follow-up for Phone Call        all questions answered.  Patient  wanted to know if she had been tested for celiac.  Patient  advised she has not been screened for celiac best I can tell. Follow-up by: Darcey Nora RN, CGRN,  December 27, 2009 1:58 PM

## 2010-05-30 NOTE — Progress Notes (Signed)
Summary: discuss results  Phone Note Call from Patient Call back at [phone]   Caller: Patient Call For: Dr. Juanda Chance Reason for Call: Talk to Nurse Summary of Call: questions regarding [place] results Initial call taken by: Vallarie Mare,  March 06, 2010 9:33 AM  Follow-up for Phone Call        Patient called to report that last night when showering she noticed an indention on her right side at the end of her c-section incision. The area  feels like a "marble". Area is not painful. she wants to be sure the ultrasound looked at this area and it is not adhesions that are causing her IBS symptoms.  Please, advise. Follow-up by: Jesse Fall RN,  March 06, 2010 10:02 AM  Additional Follow-up for Phone Call Additional follow up Details #1::        The ultrasound looked onlu at the upper abdomen, it did not  include pelvic structures  such as the site of her C-section. I would like to look at the "nodule" at the end of her scar, it is likely to be a scar tissue within the abdominal wall. Before oredering a separate pelvis ultrasound i would like to  uunderstand her concern. It is not likely to be involving her colon.  Additional Follow-up by: Hart Carwin MD,  March 06, 2010 9:34 PM     Appended Document: discuss results I am a little confused about your note. Do you want me to schedule an appointment for pt? She does not have a f/u appointment scheduled. Jesse Fall, RN 03/07/10 9:22  Appended Document: discuss results I have spoken to the pt at length, all concerns answered. No need for an appointment.

## 2010-05-30 NOTE — Progress Notes (Signed)
Summary: Test results  Phone Note Call from Patient Call back at [phone]   Caller: Patient Call For: Dr Juanda Chance Reason for Call: Talk to Nurse Summary of Call: Would like test results.   Initial call taken by: Misty Stanley,  December 22, 2009 11:23 AM  Follow-up for Phone Call        no answer and no machine at the home number.  Attempted to reach her at her work number and was told she no longer works there.  I will continue to try and reach the patient. Darcey Nora RN, [facility]  December 22, 2009 11:42 AM  reviewed results with the patient, she is feeling much better. She will call back for further problems. Follow-up by: Darcey Nora RN, CGRN,  December 22, 2009 1:53 PM

## 2010-05-30 NOTE — Miscellaneous (Signed)
Summary: bentyll rx  Clinical Lists Changes  Medications: Added new medication of BENTYL 10 MG  CAPS (DICYCLOMINE HCL) one twice daily by mouth - Signed Rx of BENTYL 10 MG  CAPS (DICYCLOMINE HCL) one twice daily by mouth;  #20 x 1;  Signed;  Entered by: Oda Cogan RN;  Authorized by: Hart Carwin MD;  Method used: Electronically to [id number]*, [address] [place], [place], [place]  [address], Ph: [phone] or [phone], Fax: [phone]    Prescriptions: BENTYL 10 MG  CAPS (DICYCLOMINE HCL) one twice daily by mouth  #20 x 1   Entered by:   Oda Cogan RN   Authorized by:   Hart Carwin MD   Signed by:   Oda Cogan RN on 12/15/2009   Method used:   Electronically to        [facility] [address] [phone]* (retail)       [address]       [place], [place]  [address]       Ph: [phone] or [phone]       Fax: [phone]   RxID:   [id number]

## 2010-05-30 NOTE — Letter (Signed)
Summary: Patient Notice- Colon Biospy Results  [facility]  [address] [place], [place] [address]   Phone: [phone]  Fax: [phone]        December 19, 2009 MRN: [id number]    [facility] [address] [place], [place]  [address]    Dear Ms. Romberger,  I am pleased to inform you that the biopsies taken during your recent colonoscopy did not show any evidence of cancer upon pathologic examination.The biopsies from Your terminal ileum and from Your colon all show normal tissue without any evidence of colitis.  Additional information/recommendations:  __No further action is needed at this time.  Please follow-up with      your primary care physician for your other healthcare needs.  __Please call [phone] to schedule a return visit to review      your condition.  _x_Continue with the treatment plan as outlined on the day of your      exam.  _x_You should have a repeat colonoscopy examination for this problem at age 54            Please call us if you are having persistent problems or have questions about your condition that have not been fully answered at this time.  Sincerely,  Hart Carwin MD   This letter has been electronically signed by your physician.  Appended Document: Patient Notice- Colon Biospy Results letter mailed

## 2010-05-31 ENCOUNTER — Other Ambulatory Visit: Payer: Self-pay | Admitting: Internal Medicine

## 2010-05-31 ENCOUNTER — Encounter (INDEPENDENT_AMBULATORY_CARE_PROVIDER_SITE_OTHER): Payer: Self-pay | Admitting: *Deleted

## 2010-05-31 ENCOUNTER — Encounter: Payer: Self-pay | Admitting: Internal Medicine

## 2010-05-31 ENCOUNTER — Other Ambulatory Visit: Payer: BC Managed Care – PPO

## 2010-05-31 DIAGNOSIS — R197 Diarrhea, unspecified: Secondary | ICD-10-CM

## 2010-05-31 DIAGNOSIS — K589 Irritable bowel syndrome without diarrhea: Secondary | ICD-10-CM

## 2010-05-31 DIAGNOSIS — Z8719 Personal history of other diseases of the digestive system: Secondary | ICD-10-CM

## 2010-06-01 ENCOUNTER — Encounter: Payer: Self-pay | Admitting: Internal Medicine

## 2010-06-02 ENCOUNTER — Encounter: Payer: Self-pay | Admitting: Internal Medicine

## 2010-06-02 ENCOUNTER — Telehealth: Payer: Self-pay | Admitting: Internal Medicine

## 2010-06-07 ENCOUNTER — Ambulatory Visit (HOSPITAL_COMMUNITY)
Admission: RE | Admit: 2010-06-07 | Discharge: 2010-06-07 | Disposition: A | Discharge status: Home / Self Care | Payer: BC Managed Care – PPO | Source: Ambulatory Visit | Attending: Internal Medicine | Admitting: Internal Medicine

## 2010-06-07 DIAGNOSIS — R109 Unspecified abdominal pain: Secondary | ICD-10-CM | POA: Insufficient documentation

## 2010-06-07 DIAGNOSIS — R197 Diarrhea, unspecified: Secondary | ICD-10-CM | POA: Insufficient documentation

## 2010-06-07 NOTE — Progress Notes (Signed)
Summary: Having problems again  Phone Note Call from Patient Call back at [phone]   Call For: Dr Juanda Chance Reason for Call: Talk to Nurse Summary of Call: Is having alot of problems again and wonders if she can speak to nurse and determine if there are any testing that can be done. Initial call taken by: Leanor Kail [facility],  May 29, 2010 10:53 AM  Follow-up for Phone Call        Message left for patient to call back.Jesse Fall RN  May 29, 2010 1:13 PM Spoke with patient. For the last week, she has had some of the same symptoms she had in the summer when she had c. diff. Diarrhea up to 8 times/day until yesterday and today when she has had it only in the AM. Urgency in the AM also. Abd pain that varies from in the lower left quad to all the way across her stomach. Denies recent antibiotic use, nausea and vomiting. She thinks she made have had a low grade fever but is not sure. She has been on Align for 3 days. She has not used Imodium but will use it as needed now. She is very concerned she has c. diff again because she has an infant. hx IBS/diarrhea, pseudomembranous colitis. Please, advise. 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  please order stool for C.diff by PCR, and Lactoferrin, also order repeat IgA and IBD markers ( basic), and schedule for SBFT to assess for small bowl abnormality, Then OVisit. Additional Follow-up by: Hart Carwin MD,  May 30, 2010 12:32 PM    Additional Follow-up for Phone Call Additional follow up Details #2::    Message left for patient to call back.Jesse Fall RN  May 31, 2010 9:15 AM Spoke with patient. She will come today and get the  stool sample containers. She would like to call her insurance company and check on coverage before getting the [facility] IBD testing. Patient scheduled for SBFT at [facility] radiology on 06/07/10 8:45 AM arrival,9:00 AM procedure. Patient instructed to have nothing to eat or drink after midnight.  Patient scheduled to see Dr. Juanda Chance on 06/14/10 at 9:00 AM. Patient will come by and sign the financial waiver when she decides what to  do. Follow-up by: Jesse Fall RN,  May 31, 2010 1:13 PM  Additional Follow-up for Phone Call Additional follow up Details #3:: Details for Additional Follow-up Action Taken: excellent!   Appended Document: Having problems again Patient came by to sign the Financial Waiver for [facility] Lab Testing- IBD sig Diagnostic [phone] and insurance may not pay. She has decided to go forward with the lab test.

## 2010-06-07 NOTE — Progress Notes (Signed)
Summary: Test results  Phone Note Call from Patient Call back at [phone]   Caller: Patient Call For: Dr. Juanda Chance Reason for Call: Lab or Test Results Summary of Call: Calling about her Test results Initial call taken by: Karna Christmas,  June 02, 2010 1:14 PM  Follow-up for Phone Call        Patient advised that the tests we have received back so far have been fairly normal. Advised her that we are awaiting [facility] testing to come back. She should follow plan as outlined previously  (she needs to keep UGI and appointment with Dr Juanda Chance). Patient verbalizes understanding. Follow-up by: Lamona Curl CMA (AAMA),  June 02, 2010 2:42 PM

## 2010-06-12 ENCOUNTER — Telehealth: Payer: Self-pay | Admitting: Internal Medicine

## 2010-06-14 ENCOUNTER — Ambulatory Visit (INDEPENDENT_AMBULATORY_CARE_PROVIDER_SITE_OTHER): Payer: BC Managed Care – PPO | Admitting: Internal Medicine

## 2010-06-14 ENCOUNTER — Encounter: Payer: Self-pay | Admitting: Internal Medicine

## 2010-06-14 DIAGNOSIS — R197 Diarrhea, unspecified: Secondary | ICD-10-CM

## 2010-06-21 ENCOUNTER — Telehealth: Payer: Self-pay | Admitting: Internal Medicine

## 2010-06-21 NOTE — Assessment & Plan Note (Signed)
Summary: follow up    History of Present Illness Visit Type: Initial Consult Primary GI MD: [name] MD Primary Provider: Birdie Sons, MD Requesting Provider: Birdie Sons, MD Chief Complaint: F/u for abd pain and diarrhea. Pt states that the pain and diarrhea comes and goes but no complaints today  History of Present Illness:   This is a 40 white female with intermittent diarrhea and a history of recent pseudomembranous colitis while vacationing in [place] in the Summer of 2011. She has undergone an extensive workup which has included normal  colonoscopy, small bowel follow-through, sprue profile and stool studies. The only abnormality has been low IgA levels at 66 and 67. Her IBD markers are negative. Her upper abdominal ultrasound was normal. She is currently doing well.   GI Review of Systems      Denies abdominal pain, acid reflux, belching, bloating, chest pain, dysphagia with liquids, dysphagia with solids, heartburn, loss of appetite, nausea, vomiting, vomiting blood, weight loss, and  weight gain.        Denies anal fissure, black tarry stools, change in bowel habit, constipation, diarrhea, diverticulosis, fecal incontinence, heme positive stool, hemorrhoids, irritable bowel syndrome, jaundice, light color stool, liver problems, rectal bleeding, and  rectal pain.    Current Medications (verified): 1)  Zyrtec Allergy 10 Mg Tabs (Cetirizine Hcl) .... Once Daily  Allergies (verified): No Known Drug Allergies  Past History:  Past Medical History: Reviewed history from 12/12/2009 and no changes required. allergy induced asthma Asthma Dil. and curretage 3 weeks ago for Uterine polyps Arrhythmia Irritable Bowel Syndrome Interstitial Cystitis  Past Surgical History: Reviewed history from 12/27/2009 and no changes required. D&C 3 weeks ago C- section x 2 Resection of endometrial polyp bilateral tubal sterilization  Family History: Reviewed history from  12/27/2009 and no changes required. Crohns---brother bladder CA, father Family History of Irritable Bowel Syndrome: No FH of Colon Cancer:  Social History: Occupation: Unemployed Married Childern Patient has never smoked.  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 She has presence of low IgA levels, therefore   the diagnosis of sprue cannot be made on the basis of negative r sprue profile. She would have to have small bowel biopsies to rule out possibility of villous atrophy. She is also more prone toward Giardia infections due to low IgA levels. Since she is doing well now, we decided not to treat her and not to proceed a small bowel biopsy but she will call us if she has another flareup of  diarrhea .We would then consider retreating her with Flagyl or possibly proceeding with a small bowel biopsy.  Problem # 2:  Hx of CLOSTRIDIUM DIFFICILE COLITIS, HX OF (ICD-V12.79) Patient's last C. difficile toxin by PCR was negative.  Patient Instructions: 1)  Stay on a high-fiber diet. 2)  Call us with any episodes of diarrhea. 3)  Consider  a short course of Flagyl 250 t.i.d. x 10 days. 4)  Consider small bowel biopsies if diarrhea continues. 5)  Copy sent to : Dr Cato Mulligan 6)  The medication list was reviewed and reconciled.  All changed / newly prescribed medications were explained.  A complete medication list was provided to the patient / caregiver.

## 2010-06-21 NOTE — Progress Notes (Signed)
Summary: Results  Phone Note Call from Patient Call back at Home Phone [phone] Call back at [phone]   Caller: Patient Call For: Dr. Juanda Chance Reason for Call: Talk to Nurse Summary of Call: Pt is calling for results of testing Initial call taken by: [place] Johnson,  June 12, 2010 4:14 PM  Follow-up for Phone Call        Patient is calling for the results of [facility] testing. Follow-up by: Jesse Fall RN,  June 12, 2010 4:19 PM  Additional Follow-up for Phone Call Additional follow up Details #1::        IBD profile drawn on 05/31/2010, not back yet. Additional Follow-up by: Hart Carwin MD,  June 12, 2010 5:45 PM     Appended Document: Results Message left for patient to call back.Jesse Fall, 06/13/10 9:25 AM  Appended Document: Results Told patient Dr. Juanda Chance does not have labs to review yet.

## 2010-06-21 NOTE — Procedures (Signed)
Summary: [facility] Diagnostic  [facility]   Imported By: Christie Nottingham CMA (AAMA) 06/13/2010 09:06:28  _____________________________________________________________________  External Attachment:    Type:   Image     Comment:   External Document  Appended Document: [facility] Diagnostic please call pt with negative IBD markers for Crohn's disease or ulcerative colitis. So I think her diarrhea if from an IBS. So far the work up has not  shown any specific disease. I will be happy to see her in follow up.  Appended Document: [facility] Diagnostic Patient aware of results. She wants to keep her appointment tomorrow.

## 2010-06-22 ENCOUNTER — Telehealth (INDEPENDENT_AMBULATORY_CARE_PROVIDER_SITE_OTHER): Payer: Self-pay | Admitting: *Deleted

## 2010-06-27 NOTE — Progress Notes (Signed)
Summary: Triage  Phone Note Call from Patient Call back at Home Phone [phone]   Caller: Patient Call For: Dr. Juanda Chance Reason for Call: Talk to Nurse Summary of Call: Diarrhea, nausea, upset stomach...wants to discuss Initial call taken by: Karna Christmas,  June 21, 2010 1:17 PM  Follow-up for Phone Call        No answer at home number. Jesse Fall, RN 06/21/10 1:25 AM Follow-up by: Jesse Fall RN,  June 21, 2010 1:29 PM  Additional Follow-up for Phone Call Additional follow up Details #1::        Patient called back. She states she had had diarrhea x2 today. The last time she had diarrhea it was watery. Denies nausea. States she does not have abdominal pain and only has slight crampy feeling. States she does feel tired but she was up twice last night with her daughter. States Dr. Juanda Chance wanted to know when she had diarrhea again. Please, advise Additional Follow-up by: Jesse Fall RN,  June 21, 2010 1:37 PM    Additional Follow-up for Phone Call Additional follow up Details #2::    please give pt Flagyl 250 mg, #30, 1 by mouth three times a day, no refill. We discussed that on her last visit.  Follow-up by: Hart Carwin MD,  June 21, 2010 11:32 PM  Additional Follow-up for Phone Call Additional follow up Details #3:: Details for Additional Follow-up Action Taken: Spoke with patient and she wants to know a little more about why she needs to take Flagyl again. She states she did not really "hear" everything Dr. Juanda Chance said a her visit because she had her baby with her. She would like to know what is being treated with Flagyl and why. Please, advise. We agreed that since her IgA is low, she is likely to have Giardia or other GI infections causing diarrhea. If she had to have anothe "attack" we would retreat her with Flagyl.  Patient given Dr. Regino Schultze recommendations. She agrees with taking Flagyl. Rx sent to her pharmacy. Additional Follow-up by: Jesse Fall  RN,  June 22, 2010 8:47 AM  New/Updated Medications: METRONIDAZOLE 250 MG TABS (METRONIDAZOLE) Take one by mouth TID Prescriptions: METRONIDAZOLE 250 MG TABS (METRONIDAZOLE) Take one by mouth TID  #30 x 0   Entered by:   Jesse Fall RN   Authorized by:   Hart Carwin MD   Signed by:   Jesse Fall RN on 06/22/2010   Method used:   Electronically to        [facility] [address] [phone]* (retail)       [address]       [place], [place]  [address]       Ph: [phone] or [phone]       Fax: [phone]   RxID:   [id number]

## 2010-06-27 NOTE — Progress Notes (Signed)
Summary: Patient has questions about small bowel biopsy  Phone Note Call from Patient   Caller: Patient Call For: nurse Summary of Call: Patient calling back because she is thinking about everything and she is thinking she may want to go ahead and have the small bowel biopsy done. She wants to know Dr. Regino Schultze thoughts on this. She is willing to take the Flagyl but is wondering about the bx also. She is also asking if any of the GI infections she is getting, may be passed on to her family. Please, advise. Initial call taken by: Jesse Fall RN,  June 22, 2010 9:55 AM  Follow-up for Phone Call        If she has Giardia , it could. If she wants to we can set up small bowl Bx  after she finishes her ? Flagyl? Follow-up by: Hart Carwin MD,  June 22, 2010 10:11 AM  Additional Follow-up for Phone Call Additional follow up Details #1::        She would like to set up the small bowel biopsy after she completes the Flagyl. She is also asking about the low IgA. If she gets rid of the GI infections will this go back up? She is wondering why the IgA is low and what can be done to get it back up. Please,advise. Additional Follow-up by: Jesse Fall RN,  June 22, 2010 11:28 AM    Additional Follow-up for Phone Call Additional follow up Details #2::    we will discuss at next visit. Follow-up by: Hart Carwin MD,  June 22, 2010 12:59 PM   Appended Document: Patient has questions about small bowel biopsy Message left for patient to call back.    Appended Document: Patient has questions about small bowel biopsy Patient given Dr. Regino Schultze recommendation. She will keep 07/03/2010 appointment to discuss.

## 2010-07-03 ENCOUNTER — Encounter: Payer: Self-pay | Admitting: Internal Medicine

## 2010-07-03 ENCOUNTER — Ambulatory Visit (INDEPENDENT_AMBULATORY_CARE_PROVIDER_SITE_OTHER): Payer: BC Managed Care – PPO | Admitting: Internal Medicine

## 2010-07-03 ENCOUNTER — Other Ambulatory Visit: Payer: BC Managed Care – PPO

## 2010-07-03 ENCOUNTER — Other Ambulatory Visit: Payer: Self-pay | Admitting: Internal Medicine

## 2010-07-03 DIAGNOSIS — R197 Diarrhea, unspecified: Secondary | ICD-10-CM

## 2010-07-05 ENCOUNTER — Encounter (INDEPENDENT_AMBULATORY_CARE_PROVIDER_SITE_OTHER): Payer: BC Managed Care – PPO

## 2010-07-05 ENCOUNTER — Encounter: Payer: Self-pay | Admitting: Internal Medicine

## 2010-07-05 DIAGNOSIS — E538 Deficiency of other specified B group vitamins: Secondary | ICD-10-CM | POA: Insufficient documentation

## 2010-07-11 NOTE — Assessment & Plan Note (Signed)
Summary: follow up abdominal pain/sheri   History of Present Illness Visit Type: Follow-up Visit Primary GI MD: [name] MD Primary Provider: Birdie Sons, MD Requesting Provider: na Chief Complaint: Pt c/o upset stomach and loose stools in the morning  History of Present Illness:   This is a 40 year old white female with intermittent diarrhea. She has a history of pseudomembranous colitis in the spring 2011 while vacationing in [place]. She had a normal colonoscopy, a normal small bowel follow-through and negative  IBD markers. 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 She is being currently treated with Flagyl. The last course will end tomorrow. She has soft stools, no abdominal pain and no rectal bleeding. Her brother has severe Crohn's disease.   GI Review of Systems      Denies abdominal pain, acid reflux, belching, bloating, chest pain, dysphagia with liquids, dysphagia with solids, heartburn, loss of appetite, nausea, vomiting, vomiting blood, weight loss, and  weight gain.        Denies anal fissure, black tarry stools, change in bowel habit, constipation, diarrhea, diverticulosis, fecal incontinence, heme positive stool, hemorrhoids, irritable bowel syndrome, jaundice, light color stool, liver problems, rectal bleeding, and  rectal pain.    Current Medications (verified): 1)  Metronidazole 250 Mg Tabs (Metronidazole) .... Take One By Mouth Tid 2)  Imiquimod 5 % Crea (Imiquimod) .... As Directed  Allergies (verified): No Known Drug Allergies  Past History:  Past Medical History: Reviewed history from 12/12/2009 and no changes required. allergy induced asthma Asthma Dil. and curretage 3 weeks ago for Uterine polyps Arrhythmia Irritable Bowel Syndrome Interstitial Cystitis  Past Surgical History: Reviewed history from 12/27/2009 and no changes required. D&C 3 weeks ago C- section x 2 Resection of endometrial polyp bilateral tubal sterilization  Family  History: Reviewed history from 12/27/2009 and no changes required. Crohns---brother bladder CA, father Family History of Irritable Bowel Syndrome: No FH of Colon Cancer:  Social History: Reviewed history from 06/14/2010 and no changes required. Occupation: Unemployed Married Childern Patient has never smoked.  Alcohol Use - yes Daily Caffeine Use Illicit Drug Use - no  Review of Systems  The patient denies allergy/sinus, anemia, anxiety-new, arthritis/joint pain, back pain, blood in urine, breast changes/lumps, change in vision, confusion, cough, coughing up blood, depression-new, fainting, fatigue, fever, headaches-new, hearing problems, heart murmur, heart rhythm changes, itching, menstrual pain, muscle pains/cramps, night sweats, nosebleeds, pregnancy symptoms, shortness of breath, skin rash, sleeping problems, sore throat, swelling of feet/legs, swollen lymph glands, thirst - excessive , urination - excessive , urination changes/pain, urine leakage, vision changes, and voice change.         Pertinent positive and negative review of systems were noted in the above HPI. All other ROS was otherwise negative.   Vital Signs:  Patient profile:   40 year old female Height:      64 inches Weight:      138 pounds BMI:     23.77 BSA:     1.67 Pulse rate:   68 / minute Pulse rhythm:   regular BP sitting:   98 / 60  (left arm) Cuff size:   regular  Vitals Entered By: Ok Anis CMA (July 03, 2010 9:18 AM)   Impression & Recommendations:  Problem # 1:  IRRITABLE BOWEL SYNDROME (ICD-564.1) Patient has irritable bowel syndrome and slight decrease in IgA levels. There is no evidence of inflammatory bowel disease. We have discussed extensively implications of IgA deficiency. She would be  more prone to GI infections. She may need intermittent treatment with Flagyl. Currently, she is doing well. She also wants to know if the gluten-free diet would possibly help. Her sprue profile has been  negative but in the presence of IgA deficiency, it may not be reliable. A small bowel biopsy may be needed in the future.  Problem # 2:  Hx of CLOSTRIDIUM DIFFICILE COLITIS, HX OF (ICD-V12.79) This is not an active problem.  Other Orders: TLB-TSH (Thyroid Stimulating Hormone) (84443-TSH) TLB-B12, Serum-Total ONLY (78469-G29)  Patient Instructions: 1)  We have given you some information on Gluten free diet and Celiac Disease to look over. 2)  Your physician requests that you go to the basement floor of our office to have the following labwork completed before leaving today: TSH, B12 level. 3)  The medication list was reviewed and reconciled.  All changed / newly prescribed medications were explained.  A complete medication list was provided to the patient / caregiver.

## 2010-07-11 NOTE — Assessment & Plan Note (Signed)
Summary: First monthly B12 injection of 6/Regina  Nurse Visit   Allergies: No Known Drug Allergies  Medication Administration  Injection # 1:    Medication: Vit B12 1000 mcg    Diagnosis: B12 DEFICIENCY (ICD-266.2)    Route: IM    Site: L deltoid    Exp Date: 06/01/2011    Lot #: [id number]    Mfr: [employer] Regent    Patient tolerated injection without complications    Given by: Christie Nottingham CMA Duncan Dull) (July 05, 2010 9:41 AM)  Orders Added: 1)  Vit B12 1000 mcg [J3420]

## 2010-07-23 LAB — CBC
HCT: 33.3 % — ABNORMAL LOW (ref 36.0–46.0)
HCT: 39 % (ref 36.0–46.0)
Hemoglobin: 11.4 g/dL — ABNORMAL LOW (ref 12.0–15.0)
Hemoglobin: 13 g/dL (ref 12.0–15.0)
MCHC: 34.1 g/dL (ref 30.0–36.0)
MCV: 91.1 fL (ref 78.0–100.0)
MCV: 91.4 fL (ref 78.0–100.0)
Platelets: 231 10*3/uL (ref 150–400)
RDW: 14 % (ref 11.5–15.5)
WBC: 11.2 10*3/uL — ABNORMAL HIGH (ref 4.0–10.5)

## 2010-08-03 ENCOUNTER — Ambulatory Visit (INDEPENDENT_AMBULATORY_CARE_PROVIDER_SITE_OTHER): Payer: BC Managed Care – PPO | Admitting: Internal Medicine

## 2010-08-03 DIAGNOSIS — E538 Deficiency of other specified B group vitamins: Secondary | ICD-10-CM

## 2010-08-03 MED ORDER — CYANOCOBALAMIN 1000 MCG/ML IJ SOLN
1000.0000 ug | INTRAMUSCULAR | Status: AC
  Administered 2010-08-03 – 2010-11-10 (×4): 1000 ug via INTRAMUSCULAR

## 2010-08-23 ENCOUNTER — Telehealth: Payer: Self-pay | Admitting: Internal Medicine

## 2010-08-31 ENCOUNTER — Ambulatory Visit (INDEPENDENT_AMBULATORY_CARE_PROVIDER_SITE_OTHER): Payer: BC Managed Care – PPO | Admitting: Internal Medicine

## 2010-08-31 DIAGNOSIS — E538 Deficiency of other specified B group vitamins: Secondary | ICD-10-CM

## 2010-09-12 NOTE — Op Note (Signed)
NAME:  Kari Stephens, Kari Stephens NO.:  [id number]   MEDICAL RECORD NO.:  [id number]          PATIENT TYPE:  AMB   LOCATION:  SDC                           FACILITY:  [facility]   PHYSICIAN:  Maxie Better, M.D.DATE OF BIRTH:  [birth year]   DATE OF PROCEDURE:  DATE OF DISCHARGE:                               OPERATIVE REPORT   PREOPERATIVE DIAGNOSES:  Menorrhagia and endometrial mass.   PROCEDURE:  Diagnostic hysteroscopy, hysteroscopic resection of  endometrial polyp, and D&C.   POSTOPERATIVE DIAGNOSES:  Menorrhagia and endometrial mass.   ANESTHESIA:  General.   SURGEON.:  Maxie Better, MD.   PROCEDURE:  Under adequate general anesthesia, the patient was placed in  dorsal lithotomy position.  She was sterilely prepped and draped in  usual fashion.  The bladder was not catheterized as the patient has just  voided.  The examination under anesthesia revealed anteverted uterus.  No adnexal masses could be appreciated.  Bivalve speculum was placed in  vagina.  Single-tooth tenaculum was placed on the anterior lip of the  cervix.  The cervix was then carefully dilated up to #27 [facility] dilator.  A sorbitol primed resectoscope was introduced in the uterine cavity  without incident.  Both tubal ostia could be seen.  There were some  polypoid lesions at the distal end of the uterine cavity.  This was  removed and sent separate as  polyps.  The remaining cavity was  inspected and any additional polypoid lesion was removed.  The  resectoscope was removed.  The cavity was then curetted carefully.  The  resectoscope was then performed to recheck the cavity.  No other lesions  were noted.  No lesions in endocervical canal at which time the  procedure was felt to be complete.  Specimen labeled endometrial polyp  with endometrial curettings sent to pathology.  Estimated blood loss was  minimal.  Fluid deficit of 100 mL sorbitol. Complications:none. The  patient tolerated the  procedure well, was transferred to recovery in  stable condition.      Maxie Better, M.D.  Electronically Signed     [place]/MEDQ  D:  11/20/2007  T:  11/21/2007  Job:  [id number]

## 2010-09-15 NOTE — Discharge Summary (Signed)
NAME:  Kari Stephens, Kari Stephens NO.:  [id number]   MEDICAL RECORD NO.:  [id number]          PATIENT TYPE:  INP   LOCATION:  9104                          FACILITY:  [facility]   PHYSICIAN:  Miguel Aschoff, M.D.       DATE OF BIRTH:  1971/03/12   DATE OF ADMISSION:  04/04/2005  DATE OF DISCHARGE:  04/07/2005                                 DISCHARGE SUMMARY   FINAL DIAGNOSES:  1.  Intrauterine pregnancy at term.  2.  Failure to progress.  3.  Positive group B streptococcus.   PROCEDURE:  Primary low transverse cesarean section. Surgeon:  Dr. Malva Limes. Complications:  None.   This 40 year old G1 P0 presents at [redacted] weeks gestation in early labor. The  patient's antepartum course had been uncomplicated except for a positive  group B strep culture obtained in the office at 35 weeks. Upon admission the  patient was only 1 cm dilated. She progressed to complete and complete. She,  of course, was on antibiotics for the positive group B strep. Over the next  6-8 hours the patient was diagnosed with failure to progress and normal  labor curve even though there was an adequate labor pattern. At this point a  decision was made to proceed with a cesarean section. She was taken to the  operating room by Dr. Malva Limes on April 04, 2005, where a primary low  transverse cesarean section was performed with the delivery of an 8-pound 4-  ounce female infant with Apgars of 9 and 9. The patient's postoperative  course was benign without any significant fevers. She was felt ready for  discharge on postoperative day #3. She was sent home on a regular diet, told  to decrease activities, told to continue her prenatal vitamins and iron  supplement daily, was given Tylox one to two every 4 hours as needed for  pain, was to follow up in the office in 4 weeks.   LABORATORY DATA ON DISCHARGE:  The patient had a hemoglobin of 10.4; white  blood cell count of 17.2; and platelets of 194,000.      Leilani Able, P.A.-C.      Miguel Aschoff, M.D.  Electronically Signed    MB/MEDQ  D:  05/13/2005  T:  05/14/2005  Job:  [id number]

## 2010-09-15 NOTE — Assessment & Plan Note (Signed)
[facility]                                 ON-CALL NOTE   [name], [name]                     MRN:          [id number]  DATE:12/18/2009                            DOB:          12-10-1970    Ms. Kari Stephens called complaining of back pain.  She had a colonoscopy last  week.  Pain is worse when she turns and twists.  She has not had a  problem with back pain in the past.  She denies abdominal pain.   I explained to her that this sounds like musculoskeletal pain and then  she can take ibuprofen or any other nonsteroidal as needed for the pain.     Barbette Hair. Arlyce Dice, MD,FACG     RDK/MedQ  DD: 12/18/2009  DT: 12/19/2009  Job #: [id number]   cc:   Hedwig Morton. Juanda Chance, MD

## 2010-09-21 NOTE — Telephone Encounter (Signed)
Personal call for Dr Juanda Chance

## 2010-10-02 ENCOUNTER — Ambulatory Visit (INDEPENDENT_AMBULATORY_CARE_PROVIDER_SITE_OTHER): Payer: BC Managed Care – PPO | Admitting: Internal Medicine

## 2010-10-02 DIAGNOSIS — E538 Deficiency of other specified B group vitamins: Secondary | ICD-10-CM

## 2010-10-17 ENCOUNTER — Telehealth: Payer: Self-pay | Admitting: Internal Medicine

## 2010-10-17 DIAGNOSIS — R197 Diarrhea, unspecified: Secondary | ICD-10-CM

## 2010-10-17 NOTE — Telephone Encounter (Signed)
Please obtain stool C.Diff by PCR, and stool culture and stool lactoferrin, start Flagyl 250 mg po tid x 10 days,, #30, 1 refill

## 2010-10-17 NOTE — Telephone Encounter (Signed)
Pt with hx of Pseudomembranous Colitis in summer of 2011. She had normal COLON, SBFT, negative Sprue profile, stool studies, IBD - from [facility] and normal Abd U/S. She was given Flagyl with improvement.  Pt called to report she's had 5-7 days of upset stomach, watery diarrhea, increased gas and abdominal cramping and thinks the C Diff is back. Some days the pain and diarrhea are worse and she's afraid of getting dehydrated. She's had a lot of stress lately and wonders if that's a factor. Pt denies a temp or blood in her stool. Please advise.

## 2010-10-18 NOTE — Telephone Encounter (Signed)
Left a message for patient to call me back.

## 2010-10-18 NOTE — Telephone Encounter (Signed)
Spoke with patient and she will get stool specimen on 6/20 or 10/19/10. Patient is questioning taking Vancomycin instead of Flagyl. Please, advise.

## 2010-10-18 NOTE — Telephone Encounter (Signed)
Patient called and left a message for me to call her. Returned call and left a message to call me

## 2010-10-18 NOTE — Telephone Encounter (Signed)
Flagyl is not just specific for C.Diff but to several infectious agents causing diarrhea. If her C Diff comes back positive, then we can switch to Vancomycin

## 2010-10-19 ENCOUNTER — Other Ambulatory Visit: Payer: Self-pay | Admitting: *Deleted

## 2010-10-19 DIAGNOSIS — R197 Diarrhea, unspecified: Secondary | ICD-10-CM

## 2010-10-19 MED ORDER — METRONIDAZOLE 250 MG PO TABS: ORAL_TABLET | ORAL | Status: DC

## 2010-10-19 NOTE — Telephone Encounter (Signed)
Spoke with patient and gave her Dr. Regino Schultze recommendation. Rx sent for Flagyl to her pharmacy. Patient states she is going on vacation and does not want to take the Flagyl until after she returns on July 11th.  Discussed with patient that her diarrhea could get worse if she delays starting Flagyl. Patient voiced understanding. States she will bring in stool specimen and then start Flagyl.

## 2010-10-20 ENCOUNTER — Telehealth: Payer: Self-pay | Admitting: Internal Medicine

## 2010-10-20 MED ORDER — METRONIDAZOLE 250 MG PO TABS: ORAL_TABLET | ORAL | Status: DC

## 2010-10-20 NOTE — Telephone Encounter (Signed)
Addended by: Daphine Deutscher on: 10/20/2010 08:42 AM   Modules accepted: Orders

## 2010-10-20 NOTE — Telephone Encounter (Signed)
Spoke with patient and she is constipated. She will do stool specimen if she has diarrhea again. She realized that she had started taking Calcium tablets right before her stomach started acting up and she believes this was her problem.

## 2010-11-10 ENCOUNTER — Telehealth: Payer: Self-pay

## 2010-11-10 ENCOUNTER — Ambulatory Visit (INDEPENDENT_AMBULATORY_CARE_PROVIDER_SITE_OTHER): Payer: BC Managed Care – PPO | Admitting: Internal Medicine

## 2010-11-10 DIAGNOSIS — K589 Irritable bowel syndrome without diarrhea: Secondary | ICD-10-CM

## 2010-11-10 DIAGNOSIS — E538 Deficiency of other specified B group vitamins: Secondary | ICD-10-CM

## 2010-11-10 NOTE — Telephone Encounter (Signed)
Pt aware that the lab has been ordered and she will come in and have it done next week

## 2010-11-10 NOTE — Telephone Encounter (Signed)
Pt had b12 today and wants to know if she was tested for Vit D and if not can she be.  Per Dottie the test order was entered I will call the pt and have her come and have this done.

## 2010-12-11 ENCOUNTER — Ambulatory Visit (INDEPENDENT_AMBULATORY_CARE_PROVIDER_SITE_OTHER): Payer: BC Managed Care – PPO | Admitting: Internal Medicine

## 2010-12-11 ENCOUNTER — Other Ambulatory Visit: Payer: BC Managed Care – PPO

## 2010-12-11 DIAGNOSIS — E538 Deficiency of other specified B group vitamins: Secondary | ICD-10-CM

## 2010-12-11 DIAGNOSIS — K589 Irritable bowel syndrome without diarrhea: Secondary | ICD-10-CM

## 2010-12-11 MED ORDER — CYANOCOBALAMIN 1000 MCG/ML IJ SOLN
1000.0000 ug | INTRAMUSCULAR | Status: DC
  Administered 2010-12-11: 1000 ug via INTRAMUSCULAR

## 2010-12-11 NOTE — Patient Instructions (Signed)
Patient advised she is due for b12 labs next month I will put an order in Epic for her to come back in one month for b12 level and send Dottie a note as well.

## 2010-12-12 ENCOUNTER — Telehealth: Payer: Self-pay | Admitting: *Deleted

## 2010-12-12 NOTE — Telephone Encounter (Signed)
Message copied by Daphine Deutscher on Tue Dec 12, 2010  1:34 PM ------      Message from: Hart Carwin      Created: Tue Dec 12, 2010 12:31 PM       Please call pt with normal Vit D levels

## 2010-12-12 NOTE — Telephone Encounter (Signed)
Patient given results as per Dr. Brodie 

## 2010-12-27 ENCOUNTER — Telehealth: Payer: Self-pay | Admitting: *Deleted

## 2010-12-27 NOTE — Telephone Encounter (Signed)
Spoke with patient and reminded her labs are due to be drawn next week.

## 2010-12-27 NOTE — Telephone Encounter (Signed)
Message copied by Daphine Deutscher on Wed Dec 27, 2010 10:13 AM ------      Message from: Daphine Deutscher      Created: Wed Jul 05, 2010 11:44 AM       Due for B 12 level next week.(after 6 injections) remind patient

## 2011-01-02 ENCOUNTER — Telehealth: Payer: Self-pay | Admitting: *Deleted

## 2011-01-02 ENCOUNTER — Other Ambulatory Visit: Payer: BC Managed Care – PPO

## 2011-01-02 NOTE — Telephone Encounter (Signed)
Looks like cdiff has been diagnosed. Would recommend that she f/u with Dr. Juanda Chance

## 2011-01-02 NOTE — Telephone Encounter (Signed)
Patient calling to report she has started having an "awful stomach in the morning." States she has diarrhea to loose bowels 4-5 times in the AM then feels better in PM. Describes her stomach as sore and soured feeling. Denies cramps or bleeding. States she goes from diarrhea to constipation. She is taking her Align. She will be coming tomorrow for her B 12 level labs.

## 2011-01-02 NOTE — Telephone Encounter (Signed)
Notified pt. 

## 2011-01-02 NOTE — Telephone Encounter (Signed)
Please advice Prilosec 20 mg qd and Bentyl 10 mg po bid till her appointment in next 3 weeks.

## 2011-01-02 NOTE — Telephone Encounter (Signed)
Pt feels she is not making the progress she should be making with her C- Diff symptoms, and would like Dr. Cato Mulligan to review her chart and let her know what his ideas are.???

## 2011-01-03 ENCOUNTER — Encounter: Payer: Self-pay | Admitting: *Deleted

## 2011-01-03 MED ORDER — DICYCLOMINE HCL 10 MG PO CAPS: ORAL_CAPSULE | ORAL | Status: DC

## 2011-01-03 MED ORDER — OMEPRAZOLE 20 MG PO CPDR: 20.0000 mg | DELAYED_RELEASE_CAPSULE | Freq: Every day | ORAL | Status: DC

## 2011-01-03 NOTE — Telephone Encounter (Signed)
Spoke with patient and gave her Dr. Regino Schultze recommendations. Scheduled patient on 01/30/11 at 10:00 AM(patient needs Tues AM appt) Rx sent to pharmacy.

## 2011-01-04 ENCOUNTER — Other Ambulatory Visit: Payer: BC Managed Care – PPO

## 2011-01-04 ENCOUNTER — Other Ambulatory Visit (INDEPENDENT_AMBULATORY_CARE_PROVIDER_SITE_OTHER): Payer: BC Managed Care – PPO

## 2011-01-04 ENCOUNTER — Other Ambulatory Visit: Payer: Self-pay | Admitting: *Deleted

## 2011-01-04 DIAGNOSIS — E538 Deficiency of other specified B group vitamins: Secondary | ICD-10-CM

## 2011-01-04 LAB — VITAMIN B12: Vitamin B-12: 377 pg/mL (ref 211–911)

## 2011-01-05 ENCOUNTER — Other Ambulatory Visit: Payer: Self-pay | Admitting: *Deleted

## 2011-01-05 ENCOUNTER — Telehealth: Payer: Self-pay | Admitting: *Deleted

## 2011-01-05 DIAGNOSIS — E538 Deficiency of other specified B group vitamins: Secondary | ICD-10-CM

## 2011-01-05 MED ORDER — CYANOCOBALAMIN 1000 MCG/ML IJ SOLN
1000.0000 ug | INTRAMUSCULAR | Status: DC
  Administered 2011-01-10 – 2015-09-13 (×25): 1000 ug via INTRAMUSCULAR

## 2011-01-05 NOTE — Telephone Encounter (Signed)
Message copied by Daphine Deutscher on Fri Jan 05, 2011  2:01 PM ------      Message from: [place], [place] M      Created: Fri Jan 05, 2011 12:56 PM       B12 level improved but needs to continue  1000ug IM monthly x 6 months, then recheck

## 2011-01-05 NOTE — Telephone Encounter (Signed)
Patient notified of results. Labs in [facility] for recheck in 6 months. Note to remind patient.

## 2011-01-10 ENCOUNTER — Ambulatory Visit (INDEPENDENT_AMBULATORY_CARE_PROVIDER_SITE_OTHER): Payer: BC Managed Care – PPO | Admitting: Internal Medicine

## 2011-01-10 DIAGNOSIS — E538 Deficiency of other specified B group vitamins: Secondary | ICD-10-CM

## 2011-01-26 LAB — CBC
HCT: 42.4
Hemoglobin: 14.1
MCHC: 33.3
MCV: 91.5
Platelets: 249
RDW: 12.5

## 2011-01-26 LAB — PREGNANCY, URINE: Preg Test, Ur: NEGATIVE

## 2011-01-30 ENCOUNTER — Telehealth: Payer: Self-pay | Admitting: Internal Medicine

## 2011-01-30 ENCOUNTER — Encounter: Payer: Self-pay | Admitting: Internal Medicine

## 2011-01-30 ENCOUNTER — Ambulatory Visit (INDEPENDENT_AMBULATORY_CARE_PROVIDER_SITE_OTHER): Payer: BC Managed Care – PPO | Admitting: Internal Medicine

## 2011-01-30 VITALS — BP 108/70 | HR 78 | Ht 64.0 in | Wt 138.0 lb

## 2011-01-30 DIAGNOSIS — K589 Irritable bowel syndrome without diarrhea: Secondary | ICD-10-CM

## 2011-01-30 MED ORDER — HYOSCYAMINE SULFATE 0.125 MG SL SUBL: SUBLINGUAL_TABLET | SUBLINGUAL | Status: DC

## 2011-01-30 NOTE — Patient Instructions (Signed)
We have sent the following medications to your pharmacy for you to pick up at your convenience: Levsin SL 0.125 mg. Take 1-2 tablets under the tongue every 4 hours as needed for IBS. CC: Dr Cato Mulligan

## 2011-01-30 NOTE — Telephone Encounter (Signed)
Spoke with patient and advised that Levsin was sent to [facility] as per her AVS sheet. She wanted to find out if Dr Juanda Chance wanted her to continue Prilosec. Per Dr Juanda Chance, patient may discontinue Prilosec if she wishes. Patient advised.

## 2011-01-30 NOTE — Progress Notes (Signed)
Kari Stephens 07-09-1970 MRN [id number]    History of Present Illness:  This is a 40 year old white female who has irritable bowel syndrome with predominant diarrhea which occurs mostly in the mornings. The urgent bowel movements may interfere with her car pooling. She manages her symptoms by avoiding breakfast and wine at night prior to her car pools. She has had intermittent left upper quadrant discomfort with cramps and gas. She has been on a self-imposed  gluten-free diet but not on a strict diet. She denies constipation or rectal bleeding. Her brother has Crohn's disease. She has a history of C. difficile colitis in the summer of 2011. Her last colonoscopy in August 2011 was normal to the terminal ileum. Biopsies of the terminal ileum were normal. The fecal lactoferrin was negative. Her tissue transglutaminase level was normal. Her IgA was low at 66, normal being 60 to 378. Her B12 level was low at 266 and after-6 months of B12 shots increased up to 377.   Past Medical History  Diagnosis Date  . Asthma with hay fever   . IBS (irritable bowel syndrome)   . IC (interstitial cystitis)   . C. difficile diarrhea   . B12 deficiency   . Lipoma    Past Surgical History  Procedure Date  . Dilation and curettage of uterus   . Cesarean section     x 2  . Endometrial polyp removal   . Tubal ligation     reports that she has never smoked. She has never used smokeless tobacco. She reports that she drinks alcohol. She reports that she does not use illicit drugs. family history includes Cancer in her father; Crohn's disease in her brother; and Irritable bowel syndrome in her brother.  There is no history of Colon cancer. No Known Allergies      Review of Systems: Denies dysphagia, odynophagia, shortness of breath or heartburn weight has been stable  The remainder of the 10 point ROS is negative except as outlined in H&P   Physical Exam: General appearance  Well developed, in no  distress. Eyes- non icteric. HEENT nontraumatic, normocephalic. Mouth no lesions, tongue papillated, no cheilosis. Neck supple without adenopathy, thyroid not enlarged, no carotid bruits, no JVD. Lungs Clear to auscultation bilaterally. Cor normal S1, normal S2, regular rhythm, no murmur,  quiet precordium. Abdomen: Soft abdomen with mild discomfort left upper quadrant. No mass no bruit or pulsations. Rectal: Not done. Extremities no pedal edema. Skin no lesions. Neurological alert and oriented x 3. Psychological normal mood and affect.  Assessment and Plan:  Problem #1 Irritable bowel syndrome with predominant urgent diarrhea in the mornings. Patient still is having doubts about the correct diagnosis. We discussed the possibility of an upper endoscopy and small bowel biopsy. She feels that a gluten-free diet is beneficial to her. We will add Levsin sublingual 0.125 mg when necessary for urgency. She will continue on B12 supplements. I will see her as needed.   01/30/2011 Kari Stephens

## 2011-02-09 ENCOUNTER — Ambulatory Visit (INDEPENDENT_AMBULATORY_CARE_PROVIDER_SITE_OTHER): Payer: BC Managed Care – PPO | Admitting: Internal Medicine

## 2011-02-09 DIAGNOSIS — E538 Deficiency of other specified B group vitamins: Secondary | ICD-10-CM

## 2011-03-14 ENCOUNTER — Ambulatory Visit (INDEPENDENT_AMBULATORY_CARE_PROVIDER_SITE_OTHER): Payer: BC Managed Care – PPO | Admitting: Internal Medicine

## 2011-03-14 DIAGNOSIS — E538 Deficiency of other specified B group vitamins: Secondary | ICD-10-CM

## 2011-04-06 ENCOUNTER — Telehealth: Payer: Self-pay | Admitting: Internal Medicine

## 2011-04-06 NOTE — Telephone Encounter (Signed)
Left message for patient to call back  

## 2011-04-11 NOTE — Telephone Encounter (Signed)
No return calls from patient.  

## 2011-04-16 ENCOUNTER — Ambulatory Visit: Payer: BC Managed Care – PPO | Admitting: Internal Medicine

## 2011-04-16 DIAGNOSIS — E538 Deficiency of other specified B group vitamins: Secondary | ICD-10-CM

## 2011-04-18 ENCOUNTER — Telehealth: Payer: Self-pay | Admitting: Internal Medicine

## 2011-04-18 NOTE — Telephone Encounter (Signed)
Spoke with patient and she states she started having hemorrhoid that were painful and itchy a couple of weeks ago. Reports on time of small amount of bright, red blood on tissue. She got Anusol HC suppositories from her OB-GYN and took them for 5 days. She began to feel better and she was having her period so she stopped them. She started having problems again and restarted them today. She has about 8 left and is not sure if she has a refill. Discussed with patient using Tucks, baby wipes instead of tissue paper, Sitz baths and Baleneol for cleansing. Wants to know if she should continue the Anusol [facility] for a longer period of time and if Dr. Juanda Chance wants her to have anything else. Hx IBS. Please, advise.

## 2011-04-18 NOTE — Telephone Encounter (Signed)
Left a message for patient to call

## 2011-04-18 NOTE — Telephone Encounter (Signed)
OK to continue Anusol HC supp, also, she may need Analpram cream 2.5 %, disp 15 gm, 1 refill, to apply externally to the rectal area bid irritation. I will see her , first available. DB

## 2011-04-19 MED ORDER — HYDROCORTISONE ACE-PRAMOXINE 2.5-1 % RE CREA: TOPICAL_CREAM | RECTAL | Status: DC

## 2011-04-19 NOTE — Telephone Encounter (Signed)
Spoke with patient and gave her Dr. Regino Schultze recommendations. Rx sent to pharmacy. Scheduled patient on 04/27/11 at 8:45 AM with Dr. Juanda Chance.

## 2011-04-19 NOTE — Telephone Encounter (Signed)
Left a message for patient to call me. 

## 2011-04-27 ENCOUNTER — Encounter: Payer: Self-pay | Admitting: Internal Medicine

## 2011-04-27 ENCOUNTER — Telehealth: Payer: Self-pay

## 2011-04-27 ENCOUNTER — Ambulatory Visit (INDEPENDENT_AMBULATORY_CARE_PROVIDER_SITE_OTHER): Payer: BC Managed Care – PPO | Admitting: Internal Medicine

## 2011-04-27 VITALS — BP 100/64 | HR 76 | Ht 63.0 in | Wt 135.0 lb

## 2011-04-27 DIAGNOSIS — K602 Anal fissure, unspecified: Secondary | ICD-10-CM

## 2011-04-27 MED ORDER — HYOSCYAMINE SULFATE 0.125 MG SL SUBL: SUBLINGUAL_TABLET | SUBLINGUAL | Status: DC

## 2011-04-27 NOTE — Telephone Encounter (Signed)
Pt feels an inhaler might help here with wheezing. Please contact

## 2011-04-27 NOTE — Progress Notes (Signed)
Kari Stephens [birth year] MRN [id number]    History of Present Illness:  This is a 40 year old white female with irritable bowel syndrome with predominant diarrhea. Her last appointment was in October 2012. She was then diagnosed with anal inflammation. She has taken Anusol-HC suppositories and Analpram cream which has helped with the discomfort. She has a history of lactose intolerance and there was a question celiac disease but her profile was negative. Her colonoscopy in August 2011 with terminal ileal biopsies was negative as well. Her brother has rather severe Crohn's disease. Her small bowel follow-through in August 2011 was normal and her IBD markers were negative. She has a borderline low IgA level at 66 and 67. She is doing well on Levbid and Levsin which she takes when necessary.    Past Medical History  Diagnosis Date  . Asthma with hay fever   . IBS (irritable bowel syndrome)   . IC (interstitial cystitis)   . C. difficile diarrhea   . B12 deficiency   . Lipoma    Past Surgical History  Procedure Date  . Dilation and curettage of uterus   . Cesarean section     x 2  . Endometrial polyp removal   . Tubal ligation     reports that she has never smoked. She has never used smokeless tobacco. She reports that she drinks alcohol. She reports that she does not use illicit drugs. family history includes Cancer in her father; Crohn's disease in her brother; and Irritable bowel syndrome in her brother.  There is no history of Colon cancer. No Known Allergies      Review of Systems: Denies rectal bleeding. Positive for rectal pain with bowel movements. Occasional fecal urgency  The remainder of the 10 point ROS is negative except as outlined in H&P   Physical Exam: General appearance  Well developed, in no distress. Eyes- non icteric. HEENT nontraumatic, normocephalic. Mouth no lesions, tongue papillated, no cheilosis. Neck supple without adenopathy, thyroid not enlarged,  no carotid bruits, no JVD. Lungs Clear to auscultation bilaterally. Cor normal S1, normal S2, regular rhythm, no murmur,  quiet precordium. Abdomen: Soft nontender abdomen with normoactive bowel sounds. Rectal: Small hemorrhoidal tags externally. Normal rectal sphincter tone. On anoscopic exam there was a fissure at 9:00 which was superficial. There was no active bleeding. There were no significant internal hemorrhoids. Stool is Hemoccult negative. Extremities no pedal edema. Skin no lesions. Neurological alert and oriented x 3. Psychological normal mood and affect.  Assessment and Plan:  Problem #1 Anal irritation and a superficial anal fissure which has been responding to topical steroids and Anusol-HC suppositories. She will continue treating her irritable bowel syndrome with Levbid and Levsin and will continue topical steroid applications to the anal fissure. We will see her when necessary.   04/27/2011 Kari Stephens

## 2011-04-27 NOTE — Patient Instructions (Addendum)
We have sent the following medications to your pharmacy for you to pick up at your convenience: Levsin SL CC: Dr Cato Mulligan

## 2011-04-27 NOTE — Telephone Encounter (Signed)
Per Dr Lovell Sheehan, pt should be seen in Saturday clinic but pt declined.  She will call the clinic tomorrow if she is not feeling any better

## 2011-04-27 NOTE — Telephone Encounter (Signed)
Took triage call - productive cough, no fever, can hear wheezing on left side on occasion .Her parents were here visiting and have gone home sick - on zpac.  Husband with the flu - she has tried OTC without relief. Please advise to see or can something be called out - [employer] and [address]

## 2011-04-28 ENCOUNTER — Telehealth: Payer: Self-pay | Admitting: Family Medicine

## 2011-04-28 MED ORDER — ALBUTEROL SULFATE HFA 108 (90 BASE) MCG/ACT IN AERS: 2.0000 | INHALATION_SPRAY | RESPIRATORY_TRACT | Status: DC | PRN

## 2011-04-28 NOTE — Telephone Encounter (Signed)
Pt needs refil of ventolin inhaler - some wheezing with a cold

## 2011-05-16 ENCOUNTER — Ambulatory Visit (INDEPENDENT_AMBULATORY_CARE_PROVIDER_SITE_OTHER): Payer: BC Managed Care – PPO | Admitting: Internal Medicine

## 2011-05-16 DIAGNOSIS — E538 Deficiency of other specified B group vitamins: Secondary | ICD-10-CM

## 2011-05-22 ENCOUNTER — Telehealth: Payer: Self-pay | Admitting: Internal Medicine

## 2011-05-22 DIAGNOSIS — O926 Galactorrhea: Secondary | ICD-10-CM

## 2011-05-22 NOTE — Telephone Encounter (Signed)
Unusual story Can refer to endocrinologist

## 2011-05-22 NOTE — Telephone Encounter (Signed)
Pt wants to investigate who to see before we make the appt.

## 2011-05-22 NOTE — Telephone Encounter (Signed)
LMTB with more details.

## 2011-05-22 NOTE — Telephone Encounter (Signed)
Pt has bilateral breast milk.  Youngest child is 40 yo, and she only breast fed for 7-8 weeks.  OB/GYN did Prolactin and Thyroid, and told her they were normal, and no other tests necessary.  Wants Dr. Cato Mulligan to know there is a history of pituitary gland cysts in her family, and would like to know his opinion about this.

## 2011-05-22 NOTE — Telephone Encounter (Signed)
Having a complication with her breast. Not pregnant, but having milk coming out. Please advise. Thanks.

## 2011-06-05 ENCOUNTER — Other Ambulatory Visit: Payer: Self-pay | Admitting: Endocrinology

## 2011-06-05 DIAGNOSIS — N6452 Nipple discharge: Secondary | ICD-10-CM

## 2011-06-13 ENCOUNTER — Ambulatory Visit
Admission: RE | Admit: 2011-06-13 | Discharge: 2011-06-13 | Disposition: A | Discharge status: Home / Self Care | Payer: BC Managed Care – PPO | Source: Ambulatory Visit | Attending: Endocrinology | Admitting: Endocrinology

## 2011-06-13 DIAGNOSIS — N6452 Nipple discharge: Secondary | ICD-10-CM

## 2011-06-22 ENCOUNTER — Ambulatory Visit (INDEPENDENT_AMBULATORY_CARE_PROVIDER_SITE_OTHER): Payer: BC Managed Care – PPO | Admitting: Internal Medicine

## 2011-06-22 DIAGNOSIS — E538 Deficiency of other specified B group vitamins: Secondary | ICD-10-CM

## 2011-07-09 ENCOUNTER — Telehealth: Payer: Self-pay | Admitting: *Deleted

## 2011-07-09 NOTE — Telephone Encounter (Signed)
Message copied by Daphine Deutscher on Mon Jul 09, 2011 11:33 AM ------      Message from: Daphine Deutscher      Created: Fri Jan 05, 2011  2:27 PM       Did patient come for vit b12 level

## 2011-07-09 NOTE — Telephone Encounter (Signed)
Spoke with patient and she will get labs with her injection on 07/24/11.

## 2011-08-06 ENCOUNTER — Telehealth: Payer: Self-pay | Admitting: Internal Medicine

## 2011-08-06 NOTE — Telephone Encounter (Signed)
Spoke with patient and told her she can come in for labs anytime.

## 2011-08-20 ENCOUNTER — Telehealth: Payer: Self-pay | Admitting: *Deleted

## 2011-08-20 NOTE — Telephone Encounter (Signed)
Message copied by Daphine Deutscher on Mon Aug 20, 2011  9:57 AM ------      Message from: Daphine Deutscher      Created: Wed Aug 01, 2011  4:30 PM        Did patient have b 12 level(DB)

## 2011-08-20 NOTE — Telephone Encounter (Signed)
Left a message for patient to call me. 

## 2011-08-20 NOTE — Telephone Encounter (Signed)
Spoke with patient and she states she will come this week for labs

## 2011-08-24 ENCOUNTER — Ambulatory Visit (INDEPENDENT_AMBULATORY_CARE_PROVIDER_SITE_OTHER): Payer: BC Managed Care – PPO | Admitting: Internal Medicine

## 2011-08-24 ENCOUNTER — Other Ambulatory Visit (INDEPENDENT_AMBULATORY_CARE_PROVIDER_SITE_OTHER): Payer: BC Managed Care – PPO

## 2011-08-24 DIAGNOSIS — E538 Deficiency of other specified B group vitamins: Secondary | ICD-10-CM

## 2011-08-24 LAB — VITAMIN B12: Vitamin B-12: 365 pg/mL (ref 211–911)

## 2011-08-27 ENCOUNTER — Other Ambulatory Visit: Payer: Self-pay | Admitting: *Deleted

## 2011-08-27 DIAGNOSIS — E538 Deficiency of other specified B group vitamins: Secondary | ICD-10-CM

## 2011-09-26 ENCOUNTER — Telehealth: Payer: Self-pay | Admitting: Internal Medicine

## 2011-09-26 NOTE — Telephone Encounter (Signed)
Spoke with patient and she states for the last 2 weeks, she has felt tired. She also, has had swelling in her hands, feet and stomach. She states she does not salt her food. She also reports she went to an endocrinologist recently and had labs that she reports as normal. Suggested patient call her PCP for these symptoms.

## 2011-09-26 NOTE — Telephone Encounter (Signed)
Left a message for patient to call me. 

## 2011-09-27 ENCOUNTER — Ambulatory Visit (INDEPENDENT_AMBULATORY_CARE_PROVIDER_SITE_OTHER): Payer: BC Managed Care – PPO | Admitting: Internal Medicine

## 2011-09-27 ENCOUNTER — Telehealth: Payer: Self-pay | Admitting: Internal Medicine

## 2011-09-27 DIAGNOSIS — E538 Deficiency of other specified B group vitamins: Secondary | ICD-10-CM

## 2011-09-27 NOTE — Telephone Encounter (Signed)
Pt called and said that for the last few weeks or so pt has been experiencing a lot of swelling in hands and feet, some tingling, and at night some sob when lying down to sleep. Pt req call back from nurse.

## 2011-09-27 NOTE — Telephone Encounter (Signed)
I agree that this is a general medical problem

## 2011-09-27 NOTE — Telephone Encounter (Signed)
Left message for pt to call back  °

## 2011-09-28 ENCOUNTER — Telehealth: Payer: Self-pay | Admitting: Internal Medicine

## 2011-09-28 ENCOUNTER — Ambulatory Visit: Payer: BC Managed Care – PPO | Admitting: Family Medicine

## 2011-09-28 NOTE — Telephone Encounter (Signed)
Pt is going to see Dr Caryl Never on Monday 10/01/11

## 2011-09-28 NOTE — Telephone Encounter (Signed)
Spoke with patient and answered her questions.

## 2011-10-01 ENCOUNTER — Telehealth: Payer: Self-pay | Admitting: Internal Medicine

## 2011-10-01 ENCOUNTER — Encounter: Payer: Self-pay | Admitting: Family Medicine

## 2011-10-01 ENCOUNTER — Ambulatory Visit (INDEPENDENT_AMBULATORY_CARE_PROVIDER_SITE_OTHER): Payer: BC Managed Care – PPO | Admitting: Family Medicine

## 2011-10-01 VITALS — BP 104/70 | Temp 98.4°F | Wt 138.0 lb

## 2011-10-01 DIAGNOSIS — R609 Edema, unspecified: Secondary | ICD-10-CM

## 2011-10-01 DIAGNOSIS — M2550 Pain in unspecified joint: Secondary | ICD-10-CM

## 2011-10-01 DIAGNOSIS — R600 Localized edema: Secondary | ICD-10-CM

## 2011-10-01 LAB — POCT URINALYSIS DIPSTICK
Glucose, UA: NEGATIVE
Nitrite, UA: NEGATIVE
Protein, UA: NEGATIVE
Spec Grav, UA: <=1.005
Urobilinogen, UA: 0.2

## 2011-10-01 LAB — BASIC METABOLIC PANEL
Calcium: 9.1 mg/dL (ref 8.4–10.5)
GFR: 89.46 mL/min (ref >60.00)
Glucose, Bld: 81 mg/dL (ref 70–99)
Potassium: 3.8 mEq/L (ref 3.5–5.1)
Sodium: 143 mEq/L (ref 135–145)

## 2011-10-01 LAB — SEDIMENTATION RATE: Sed Rate: 3 mm/hr (ref 0–22)

## 2011-10-01 NOTE — Progress Notes (Signed)
  Subjective:    Patient ID: Kari Stephens, female    DOB: 25-Jun-1970, 41 y.o.   MRN: [id number]  HPI  Patient complains of some increased fluid retention and possibly couple pounds of weight gain over the past couple weeks. She's noticed edema in her hands mostly DIPs and PIP joints and also some ankles and feet. No pitting edema. No inflammatory changes such as redness or warmth. She does have some bilateral hand stiffness with relatively acute onset. Increased fatigue but sometimes has poor sleep quality with 52-year-old at home. She had normal thyroid functions this past February. No recent tick bites. No rashes. No nonsteroidal use. Patient saw an endocrinologist in February and had TSH and prolactin to assess pituitary function. These were reportedly normal.  No celiac disease but tries to follow low gluten diet.  She continues to exercise regularly. No history of inflammatory arthritis. She has a brother with Crohn's disease. Patient has history of questionable interstitial cystitis and irritable bowel syndrome.  Past Medical History  Diagnosis Date  . Asthma with hay fever   . IBS (irritable bowel syndrome)   . IC (interstitial cystitis)   . C. difficile diarrhea   . B12 deficiency   . Lipoma    Past Surgical History  Procedure Date  . Dilation and curettage of uterus   . Cesarean section     x 2  . Endometrial polyp removal   . Tubal ligation     reports that she has never smoked. She has never used smokeless tobacco. She reports that she drinks alcohol. She reports that she does not use illicit drugs. family history includes Cancer in her father; Crohn's disease in her brother; and Irritable bowel syndrome in her brother.  There is no history of Colon cancer. No Known Allergies    Review of Systems  Constitutional: Negative for fever, chills, appetite change and unexpected weight change.  Respiratory: Negative for cough and shortness of breath.   Genitourinary: Negative  for dysuria.  Musculoskeletal: Positive for joint swelling. Negative for myalgias and gait problem.  Skin: Negative for rash.  Neurological: Negative for dizziness and headaches.  Hematological: Negative for adenopathy. Does not bruise/bleed easily.       Objective:   Physical Exam  Constitutional: She is oriented to person, place, and time. She appears well-developed and well-nourished.  Neck: Neck supple. No thyromegaly present.  Cardiovascular: Normal rate and regular rhythm.   Pulmonary/Chest: Effort normal and breath sounds normal. No respiratory distress. She has no wheezes. She has no rales.  Musculoskeletal:       Hands reveal minimal-if any edema. She has no warmth or tenderness involving any joints of the digits or hand.  Wrist and elbow exams are normal. Knee exam reveals no inflammation. She has no pitting edema of feet or legs.  Lymphadenopathy:    She has no cervical adenopathy.  Neurological: She is alert and oriented to person, place, and time.          Assessment & Plan:  Bilateral leg edema which has been relatively mild. She also describes some bilateral hand edema. Minimal inflammation today. Doubt inflammatory arthritis. She is describing some stiff joints mostly involving the hands but sparing MCP joints. Check labs with urine dipstick, basic metabolic panel, sedimentation rate, ANA, rheumatoid factor. Did not repeat TSH since this was done recently and normal

## 2011-10-01 NOTE — Telephone Encounter (Signed)
All questions about previous IgA labs answered.  She will call back for any questions

## 2011-10-02 LAB — ANA: Anti Nuclear Antibody(ANA): POSITIVE — AB

## 2011-10-02 LAB — ANTI-NUCLEAR AB-TITER (ANA TITER): ANA Titer 1: 1:80 {titer} — ABNORMAL HIGH

## 2011-10-03 ENCOUNTER — Telehealth: Payer: Self-pay | Admitting: Family Medicine

## 2011-10-03 NOTE — Progress Notes (Signed)
Quick Note:  Pt informed, she had a lot of questions and concerns about why then are her hand swollen, why now? Pt requesting perhaps additional labs to check deeper into her kidney and liver function. I offered OV, she wanted a message to be sent first to see what Dr Lucie Leather thoughts were for next move? ______

## 2011-10-03 NOTE — Telephone Encounter (Signed)
This was already done

## 2011-10-03 NOTE — Telephone Encounter (Signed)
Pulled from Triage vmail. [name] pt, was here most recently to see Dr. B and had labs. Is calling requesting call back with results. Thanks!

## 2011-10-05 NOTE — Progress Notes (Signed)
Quick Note:  Message left on pt home phone ______

## 2011-10-09 ENCOUNTER — Telehealth: Payer: Self-pay | Admitting: Internal Medicine

## 2011-10-09 DIAGNOSIS — M2550 Pain in unspecified joint: Secondary | ICD-10-CM

## 2011-10-09 NOTE — Telephone Encounter (Signed)
Pt called and said that she discussed getting a referral to rheumatologist during last ov with Dr Caryl Never re: low b-12, elevated ana, low iga count. Pt is req to get referral to see Dr Zenovia Jordan with [facility]. Pls do referral asap.

## 2011-10-10 NOTE — Telephone Encounter (Signed)
Pt aware.

## 2011-10-10 NOTE — Telephone Encounter (Signed)
Let pt know I have made referral 

## 2011-10-12 ENCOUNTER — Encounter: Payer: Self-pay | Admitting: Internal Medicine

## 2011-10-12 ENCOUNTER — Ambulatory Visit (INDEPENDENT_AMBULATORY_CARE_PROVIDER_SITE_OTHER): Payer: BC Managed Care – PPO | Admitting: Internal Medicine

## 2011-10-12 VITALS — BP 108/70 | HR 98 | Temp 99.0°F | Wt 134.0 lb

## 2011-10-12 DIAGNOSIS — J45901 Unspecified asthma with (acute) exacerbation: Secondary | ICD-10-CM

## 2011-10-12 DIAGNOSIS — J329 Chronic sinusitis, unspecified: Secondary | ICD-10-CM

## 2011-10-12 DIAGNOSIS — Z8719 Personal history of other diseases of the digestive system: Secondary | ICD-10-CM

## 2011-10-12 MED ORDER — AMOXICILLIN 875 MG PO TABS: 875.0000 mg | ORAL_TABLET | Freq: Three times a day (TID) | ORAL | Status: AC

## 2011-10-12 MED ORDER — FLUTICASONE PROPIONATE 50 MCG/ACT NA SUSP: 2.0000 | Freq: Every day | NASAL | Status: DC

## 2011-10-12 MED ORDER — PREDNISONE 20 MG PO TABS: ORAL_TABLET | ORAL | Status: AC

## 2011-10-12 NOTE — Patient Instructions (Signed)
Stopped the decongestant as it doesn't seem to be helping him much.  lungs are clear today but you could definitely have some asthmatic flare. Upper respiratory infections can trigger this. Since you have had this for a week and it is not getting better we can add an antibiotic and Flonase for presumed sinusitis.  If your asthma is getting worse over the weekend she can add to 5 days of prednisone. rescue inhaler as needed call the on-call service if needed  Because you have had a history of C. difficile in the past can take a probiotic with the amoxicillin . There are some studies that show it may decrease the risk.  You should followup with her primary doctor if this is persistent or progressive.

## 2011-10-12 NOTE — Progress Notes (Signed)
  Subjective:    Patient ID: Kari Stephens, female    DOB: Mar 02, 1971, 41 y.o.   MRN: [id number]  HPI Patient comes in today for SDA for  new problem evaluation. Pt of dr Cato Mulligan saw Dr B last week for other sx such as numbness nas swelling feeling  All tests neg except  Minimal elevation of ana. She's been battling upper respiratory congestion that she says is like a head cold although could be allergy for the last week. She is taken Claritin-D among other things she is using her inhaler more and most recently he feels more short of breath especially at night. She has not exercised this week because of the way she feels. There no fever and chills although her temperature today is 99. No nausea vomiting or diarrhea today no unusual rashes. Feels tight in her upper chest. No vomiting . Some malaise    Review of Systems Negative for chest pain or syncope remote history of C. difficile. Some anxiety about above symptoms.  Past history family history social history reviewed in the electronic medical record.     Objective:   Physical Exam  BP 108/70  Pulse 98  Temp 99 F (37.2 C) (Oral)  Wt 134 lb (60.782 kg)  SpO2 100%  LMP 09/28/2011 WDWN in NAD  quiet respirations; mildly congested  . Non toxic . Bi resp distress HEENT: Normocephalic ;atraumatic , Eyes;  PERRL, EOMs  Full, lids and conjunctiva clear,,Ears: no deformities, canals nl, TM landmarks normal, Nose: no deformity or discharge but congested;face minimally tender Mouth : OP clear without lesion or edema . Neck: Supple without adenopathy or masses or bruits Chest:  Clear to A&P without wheezes rales or rhonchi CV:  S1-S2 no gallops or murmurs peripheral perfusion is normal Skin :nl perfusion and no acute rashes      Assessment & Plan:  URI  Sinusitis versus allergy   now triggering asthma reactive airway.  Discussed options antibiotic with risk and Flonase stopping the decongestant in case it is adding to her symptoms and  adding saline nose spray. She can add the prednisone over the weekend or the antibiotic with probiotics if needed.  Reassured that her lung exam is pretty good today with good pulse ox. Okay to take a plain antihistamine.   Expectant management. reviewed

## 2011-10-31 IMAGING — CR DG SMALL BOWEL
1 series · 1 of 1 positions shown · non-contrast
Comparison: None.

CLINICAL DATA: Abdominal pain.  Diarrhea.

SMALL BOWEL SERIES
TECHNIQUE: Following ingestion of a mixture of thin barium and
Entero Vu, serial small bowel images were obtained including spot
views of the terminal ileum.
Fluoroscopy time:  1.5 minutes.

[t abdomen supine]
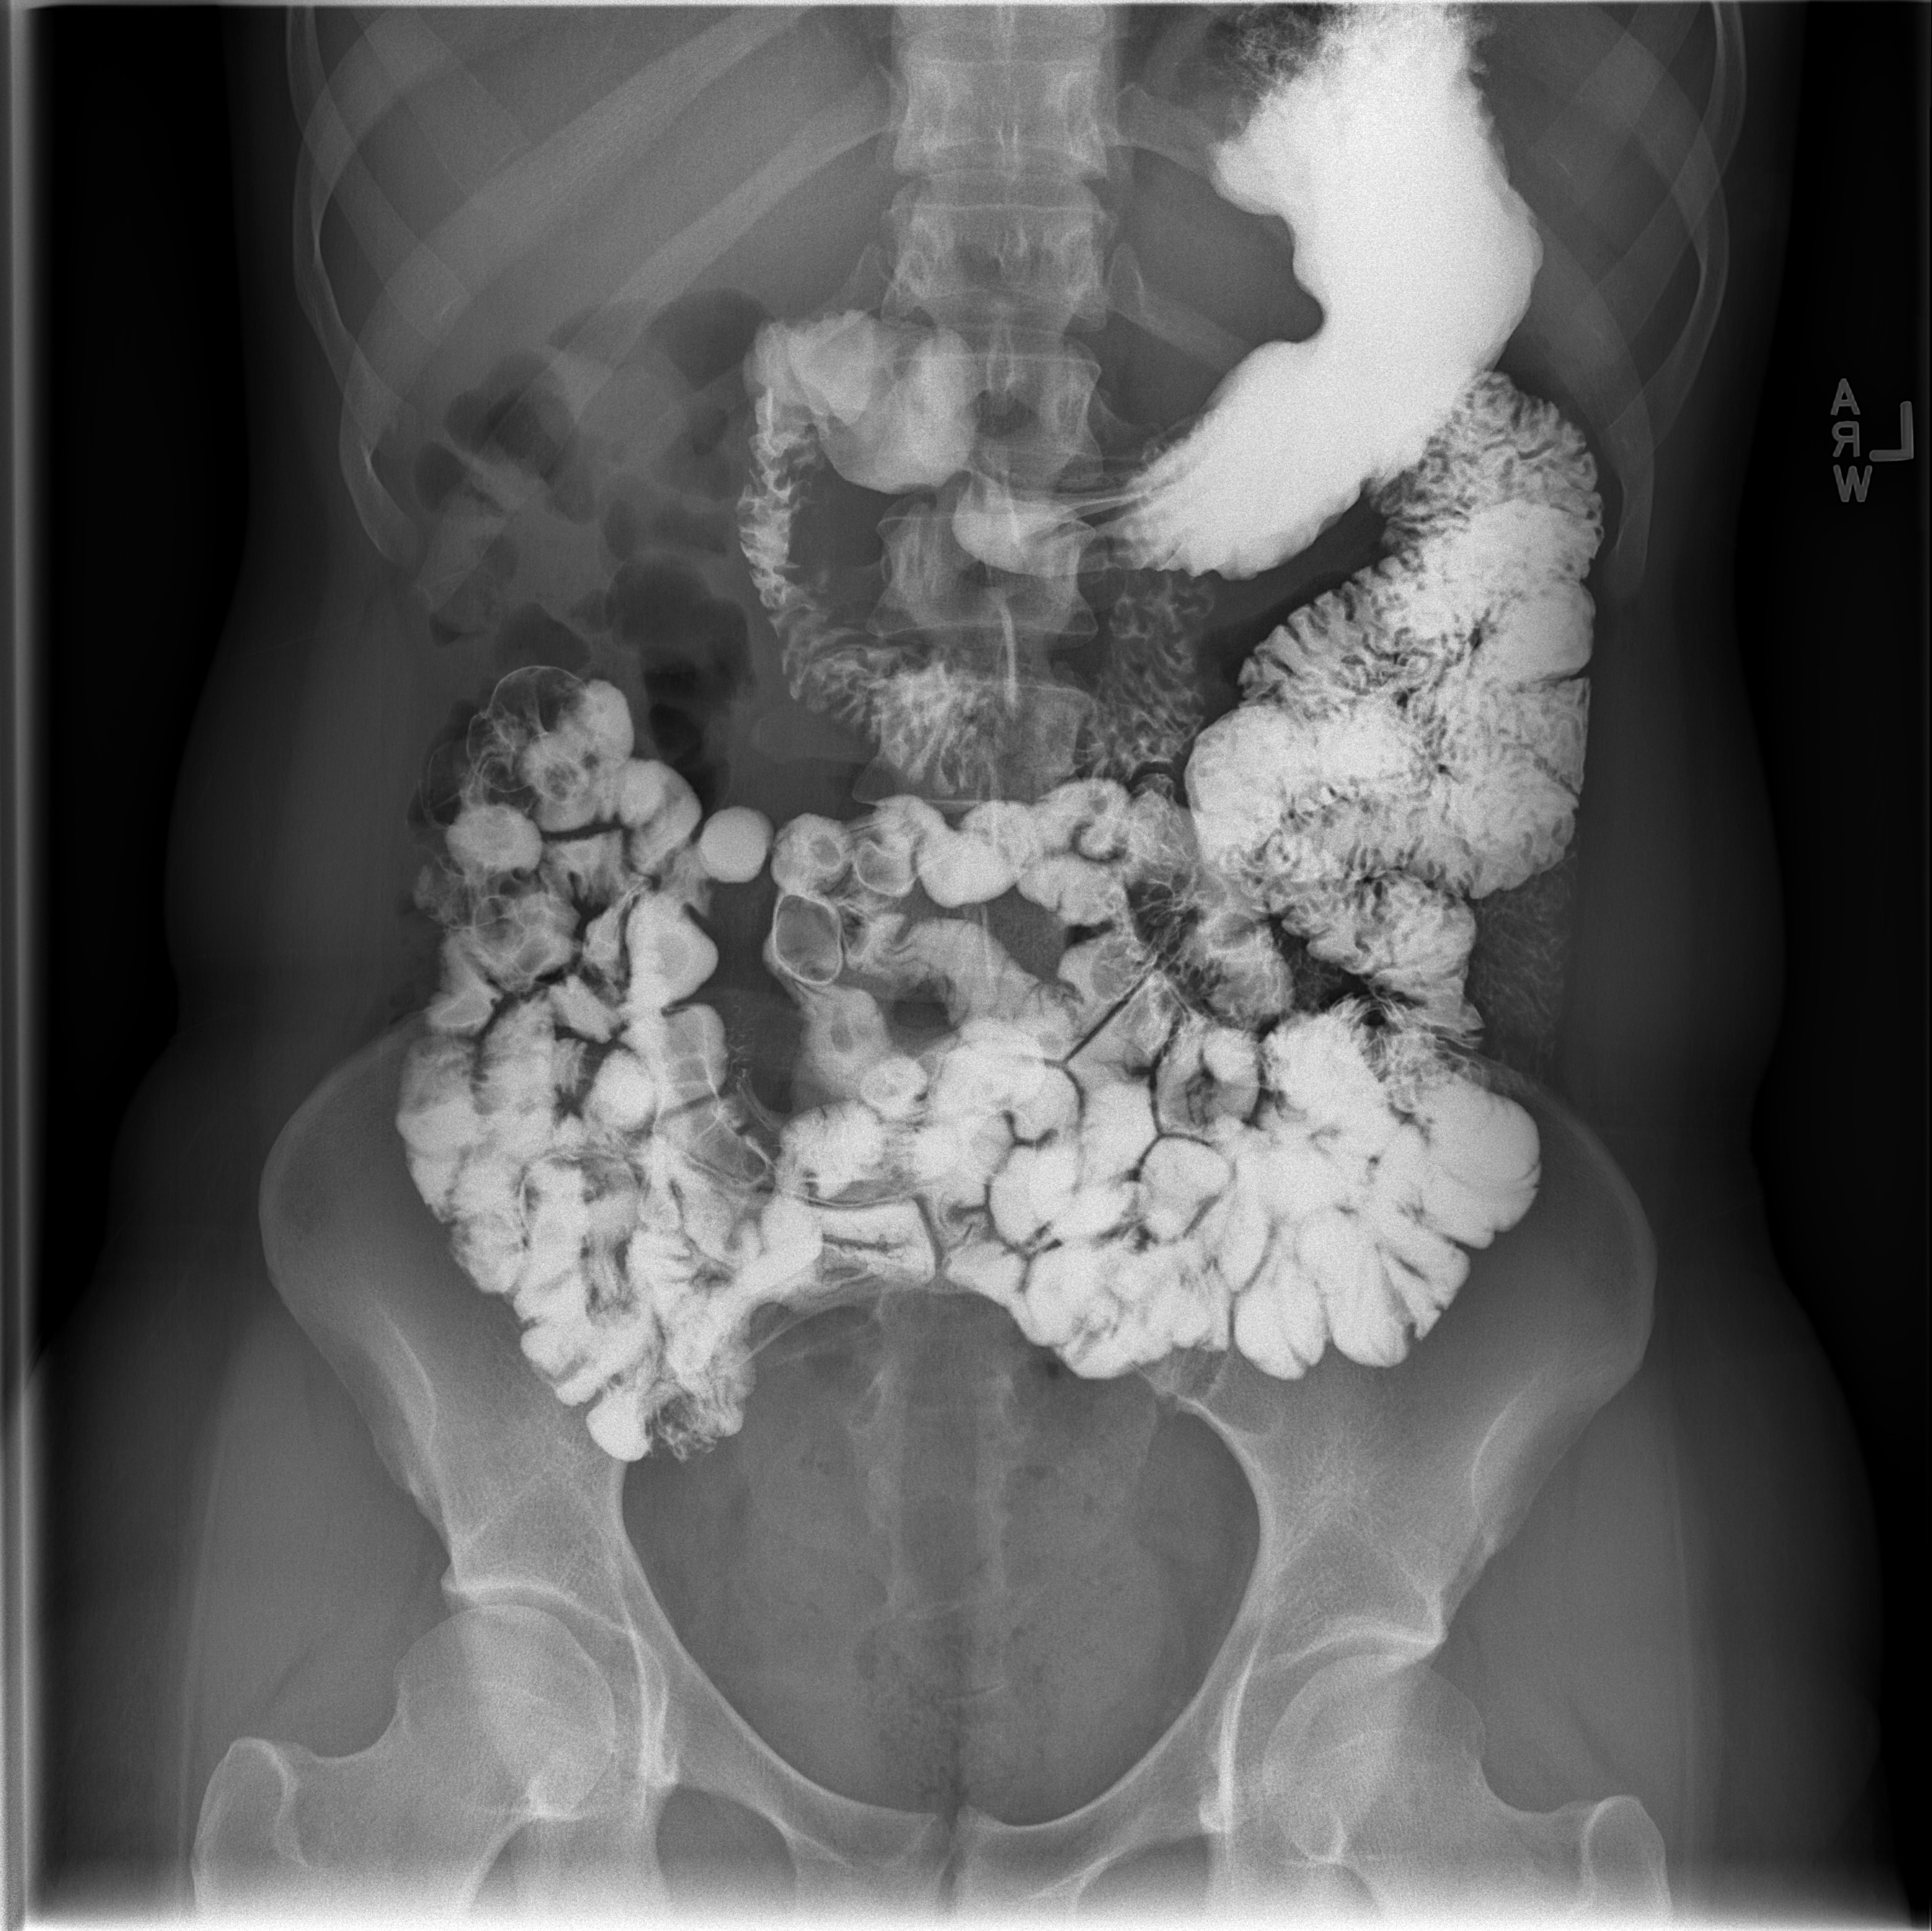

[1 of 1 positions shown; findings below may reference images not displayed]

FINDINGS: The scout radiograph demonstrates a normal bowel gas
pattern.  Mucosal patterns of the duodenum, jejunum and ileum are
normal.  The terminal ileum is well visualized and appears normal.
Small bowel transit time was approximately 2 hours.
IMPRESSION: Normal small bowel study.  No evidence of inflammatory bowel
disease or other abnormality.

## 2011-11-12 ENCOUNTER — Ambulatory Visit (INDEPENDENT_AMBULATORY_CARE_PROVIDER_SITE_OTHER): Payer: BC Managed Care – PPO | Admitting: Internal Medicine

## 2011-11-12 DIAGNOSIS — E538 Deficiency of other specified B group vitamins: Secondary | ICD-10-CM

## 2011-12-14 ENCOUNTER — Ambulatory Visit (INDEPENDENT_AMBULATORY_CARE_PROVIDER_SITE_OTHER): Payer: BC Managed Care – PPO | Admitting: Internal Medicine

## 2011-12-14 DIAGNOSIS — E538 Deficiency of other specified B group vitamins: Secondary | ICD-10-CM

## 2012-01-14 ENCOUNTER — Ambulatory Visit (INDEPENDENT_AMBULATORY_CARE_PROVIDER_SITE_OTHER): Payer: BC Managed Care – PPO | Admitting: Internal Medicine

## 2012-01-14 DIAGNOSIS — E538 Deficiency of other specified B group vitamins: Secondary | ICD-10-CM

## 2012-01-14 MED ORDER — CYANOCOBALAMIN 1000 MCG/ML IJ SOLN: 1000.0000 ug | Freq: Once | INTRAMUSCULAR | Status: DC

## 2012-02-12 ENCOUNTER — Ambulatory Visit (INDEPENDENT_AMBULATORY_CARE_PROVIDER_SITE_OTHER): Payer: BC Managed Care – PPO | Admitting: Internal Medicine

## 2012-02-12 DIAGNOSIS — E538 Deficiency of other specified B group vitamins: Secondary | ICD-10-CM

## 2012-02-12 MED ORDER — CYANOCOBALAMIN 1000 MCG/ML IJ SOLN
1000.0000 ug | INTRAMUSCULAR | Status: DC
  Administered 2012-02-12: 1000 ug via INTRAMUSCULAR

## 2012-03-03 ENCOUNTER — Other Ambulatory Visit (INDEPENDENT_AMBULATORY_CARE_PROVIDER_SITE_OTHER): Payer: BC Managed Care – PPO

## 2012-03-03 DIAGNOSIS — E538 Deficiency of other specified B group vitamins: Secondary | ICD-10-CM

## 2012-03-04 ENCOUNTER — Telehealth: Payer: Self-pay | Admitting: *Deleted

## 2012-03-04 DIAGNOSIS — E538 Deficiency of other specified B group vitamins: Secondary | ICD-10-CM

## 2012-03-04 NOTE — Telephone Encounter (Signed)
Notes Recorded by Hart Carwin, MD on 03/03/2012 at 1:53 PM Please continue B12 x 6 months then recheck B12 levels. Current B12 levels are back to normal.      Patient notified of results and recommendations. Labs in [facility]

## 2012-03-12 ENCOUNTER — Other Ambulatory Visit: Payer: Self-pay | Admitting: *Deleted

## 2012-03-12 MED ORDER — HYOSCYAMINE SULFATE 0.125 MG SL SUBL: SUBLINGUAL_TABLET | SUBLINGUAL | Status: DC

## 2012-03-14 ENCOUNTER — Ambulatory Visit (INDEPENDENT_AMBULATORY_CARE_PROVIDER_SITE_OTHER): Payer: BC Managed Care – PPO | Admitting: Internal Medicine

## 2012-03-14 DIAGNOSIS — D518 Other vitamin B12 deficiency anemias: Secondary | ICD-10-CM

## 2012-03-14 DIAGNOSIS — E538 Deficiency of other specified B group vitamins: Secondary | ICD-10-CM

## 2012-04-14 ENCOUNTER — Ambulatory Visit (INDEPENDENT_AMBULATORY_CARE_PROVIDER_SITE_OTHER): Payer: BC Managed Care – PPO | Admitting: Internal Medicine

## 2012-04-14 DIAGNOSIS — E538 Deficiency of other specified B group vitamins: Secondary | ICD-10-CM

## 2012-05-14 ENCOUNTER — Other Ambulatory Visit: Payer: Self-pay | Admitting: Obstetrics and Gynecology

## 2012-05-14 DIAGNOSIS — Z1231 Encounter for screening mammogram for malignant neoplasm of breast: Secondary | ICD-10-CM

## 2012-05-21 ENCOUNTER — Ambulatory Visit (INDEPENDENT_AMBULATORY_CARE_PROVIDER_SITE_OTHER): Payer: 59 | Admitting: Internal Medicine

## 2012-05-21 DIAGNOSIS — E538 Deficiency of other specified B group vitamins: Secondary | ICD-10-CM

## 2012-06-01 ENCOUNTER — Emergency Department (HOSPITAL_COMMUNITY)
Admission: EM | Admit: 2012-06-01 | Discharge: 2012-06-01 | Disposition: A | Discharge status: Home / Self Care | Payer: 59 | Attending: Emergency Medicine | Admitting: Emergency Medicine

## 2012-06-01 ENCOUNTER — Encounter (HOSPITAL_COMMUNITY): Payer: Self-pay | Admitting: Emergency Medicine

## 2012-06-01 ENCOUNTER — Emergency Department (HOSPITAL_COMMUNITY): Payer: 59

## 2012-06-01 DIAGNOSIS — R002 Palpitations: Secondary | ICD-10-CM | POA: Insufficient documentation

## 2012-06-01 DIAGNOSIS — Z8619 Personal history of other infectious and parasitic diseases: Secondary | ICD-10-CM | POA: Insufficient documentation

## 2012-06-01 DIAGNOSIS — Z79899 Other long term (current) drug therapy: Secondary | ICD-10-CM | POA: Insufficient documentation

## 2012-06-01 DIAGNOSIS — Z8742 Personal history of other diseases of the female genital tract: Secondary | ICD-10-CM | POA: Insufficient documentation

## 2012-06-01 DIAGNOSIS — R42 Dizziness and giddiness: Secondary | ICD-10-CM | POA: Insufficient documentation

## 2012-06-01 DIAGNOSIS — Z8719 Personal history of other diseases of the digestive system: Secondary | ICD-10-CM | POA: Insufficient documentation

## 2012-06-01 DIAGNOSIS — Z862 Personal history of diseases of the blood and blood-forming organs and certain disorders involving the immune mechanism: Secondary | ICD-10-CM | POA: Insufficient documentation

## 2012-06-01 DIAGNOSIS — Z8639 Personal history of other endocrine, nutritional and metabolic disease: Secondary | ICD-10-CM | POA: Insufficient documentation

## 2012-06-01 DIAGNOSIS — J45909 Unspecified asthma, uncomplicated: Secondary | ICD-10-CM | POA: Insufficient documentation

## 2012-06-01 LAB — CBC WITH DIFFERENTIAL/PLATELET
Eosinophils Absolute: 0.1 10*3/uL (ref 0.0–0.7)
Eosinophils Relative: 2 % (ref 0–5)
HCT: 39.4 % (ref 36.0–46.0)
Lymphocytes Relative: 42 % (ref 12–46)
Lymphs Abs: 1.7 10*3/uL (ref 0.7–4.0)
MCH: 30.3 pg (ref 26.0–34.0)
MCV: 88.3 fL (ref 78.0–100.0)
Monocytes Absolute: 0.3 10*3/uL (ref 0.1–1.0)
RBC: 4.46 MIL/uL (ref 3.87–5.11)
WBC: 4.1 10*3/uL (ref 4.0–10.5)

## 2012-06-01 LAB — BASIC METABOLIC PANEL
BUN: 10 mg/dL (ref 6–23)
CO2: 27 mEq/L (ref 19–32)
Calcium: 9 mg/dL (ref 8.4–10.5)
Creatinine, Ser: 0.82 mg/dL (ref 0.50–1.10)
GFR calc non Af Amer: 88 mL/min — ABNORMAL LOW (ref >90)
Glucose, Bld: 114 mg/dL — ABNORMAL HIGH (ref 70–99)

## 2012-06-01 LAB — POCT PREGNANCY, URINE: Preg Test, Ur: NEGATIVE

## 2012-06-01 LAB — URINALYSIS, ROUTINE W REFLEX MICROSCOPIC
Bilirubin Urine: NEGATIVE
Glucose, UA: NEGATIVE mg/dL
Hgb urine dipstick: NEGATIVE
Ketones, ur: NEGATIVE mg/dL
Protein, ur: NEGATIVE mg/dL

## 2012-06-01 NOTE — ED Notes (Signed)
Pt discharged.Vital signs stable and GCS 15 

## 2012-06-01 NOTE — ED Notes (Signed)
Onset 2 days ago dizziness one day ago ran 5 miles still feeling dizzy and continued dizziness today with nausea and left upper extremity numbness between left wrist and left shoulder.  Denies pain currently. Ax4 answering and following commands appropriate.

## 2012-06-01 NOTE — ED Provider Notes (Signed)
History     CSN: [id number]  Arrival date & time 06/01/12  1845   First MD Initiated Contact with Patient 06/01/12 2040      Chief Complaint  Patient presents with  . Dizziness   HPI  History provided by the patient and husband. Patient is a 42 year old female with history of asthma and IBS who presents with rates of increased and persistent lightheadedness and dizziness symptoms. Symptoms have been waxing and waning but steadily increasing and constant for the past several days. Patient states symptoms are worse today while she was up doing several loads of laundry and house chores. She feels unsteady while she walks and he first stands up. Symptoms are better while sitting and laying and rest. Patient also took one dose of Dramamine prior to arrival and states that she feels some improvements from this. Symptoms were not described as the room or herself spinning. Patient states she feels more lightheaded and like near syncope. She also mentions occasional heart flutters but reports history of irregular heart beat for which she was evaluated with a weeklong heart monitor in the past. She denies any associated chest pain or shortness of breath. Patient does have an upcoming appointment with OB/GYN for heavier than normal menstrual bleeding for the past several months. Patient is currently finishing her menstrual cycle now. Denies any other vaginal discharge or pain.    Past Medical History  Diagnosis Date  . Asthma with hay fever   . IBS (irritable bowel syndrome)   . IC (interstitial cystitis)   . C. difficile diarrhea   . B12 deficiency   . Lipoma     Past Surgical History  Procedure Date  . Dilation and curettage of uterus   . Cesarean section     x 2  . Endometrial polyp removal   . Tubal ligation     Family History  Problem Relation Age of Onset  . Crohn's disease Brother   . Cancer Father     bladder  . Irritable bowel syndrome Brother   . Colon cancer Neg Hx      History  Substance Use Topics  . Smoking status: Never Smoker   . Smokeless tobacco: Never Used  . Alcohol Use: Yes    OB History    Grav Para Term Preterm Abortions TAB SAB Ect Mult Living                  Review of Systems  Constitutional: Positive for fatigue. Negative for fever and chills.  Respiratory: Negative for shortness of breath.   Cardiovascular: Positive for palpitations. Negative for chest pain.  Gastrointestinal: Negative for nausea, vomiting, abdominal pain and diarrhea.  Genitourinary: Positive for menstrual problem. Negative for dysuria, frequency, hematuria and flank pain.  Neurological: Positive for dizziness and light-headedness. Negative for syncope, facial asymmetry and headaches.  Psychiatric/Behavioral: Negative for confusion.  All other systems reviewed and are negative.    Allergies  Sulfa antibiotics  Home Medications   Current Outpatient Rx  Name  Route  Sig  Dispense  Refill  . ALBUTEROL SULFATE HFA 108 (90 BASE) MCG/ACT IN AERS   Inhalation   Inhale 2 puffs into the lungs daily as needed. For shortness of breath or wheezing         . CYANOCOBALAMIN 1000 MCG/ML IJ SOLN   Intramuscular   Inject 1,000 mcg into the muscle every 30 (thirty) days. Administered at Dr. Regino Schultze office         .  DESONIDE 0.05 % EX LOTN   Topical   Apply 1 application topically daily as needed. Dry skin (ears)         . DRAMAMINE PO   Oral   Take 1 tablet by mouth once. For nausea and dizziness         . HYOSCYAMINE SULFATE 0.125 MG SL SUBL   Sublingual   Place 0.125 mg under the tongue daily as needed. For ibs symptoms         . ADULT MULTIVITAMIN W/MINERALS CH   Oral   Take 1 tablet by mouth daily.         Marland Kitchen ALIGN PO   Oral   Take 1 capsule by mouth daily as needed. For ibs symptoms           BP 114/74  Pulse 63  Temp 97.3 F (36.3 C) (Oral)  Resp 16  SpO2 99%  Physical Exam  Nursing note and vitals  reviewed. Constitutional: She is oriented to person, place, and time. She appears well-developed and well-nourished. No distress.  HENT:  Head: Normocephalic and atraumatic.  Eyes: Conjunctivae normal and EOM are normal. Pupils are equal, round, and reactive to light.       No nystagmus.  Neck: Normal range of motion. Neck supple.       No meningeal signs  Cardiovascular: Normal rate and regular rhythm.   No murmur heard. Pulmonary/Chest: Effort normal and breath sounds normal. No stridor. No respiratory distress. She has no wheezes. She has no rales.  Abdominal: Soft. There is no tenderness. There is no rigidity, no rebound, no guarding, no CVA tenderness and no tenderness at McBurney's point.  Musculoskeletal: Normal range of motion. She exhibits no edema and no tenderness.  Neurological: She is alert and oriented to person, place, and time. She has normal strength. No cranial nerve deficit or sensory deficit. Coordination and gait normal.       Normal finger to nose.  Skin: Skin is warm and dry. No rash noted. No erythema. There is pallor.  Psychiatric: She has a normal mood and affect. Her behavior is normal.    ED Course  Procedures   Results for orders placed during the hospital encounter of 06/01/12  CBC WITH DIFFERENTIAL      Component Value Range   WBC 4.1  4.0 - 10.5 K/uL   RBC 4.46  3.87 - 5.11 MIL/uL   Hemoglobin 13.5  12.0 - 15.0 g/dL   HCT 86.5  78.4 - 69.6 %   MCV 88.3  78.0 - 100.0 fL   MCH 30.3  26.0 - 34.0 pg   MCHC 34.3  30.0 - 36.0 g/dL   RDW 29.5  28.4 - 13.2 %   Platelets 155  150 - 400 K/uL   Neutrophils Relative 48  43 - 77 %   Neutro Abs 2.0  1.7 - 7.7 K/uL   Lymphocytes Relative 42  12 - 46 %   Lymphs Abs 1.7  0.7 - 4.0 K/uL   Monocytes Relative 8  3 - 12 %   Monocytes Absolute 0.3  0.1 - 1.0 K/uL   Eosinophils Relative 2  0 - 5 %   Eosinophils Absolute 0.1  0.0 - 0.7 K/uL   Basophils Relative 1  0 - 1 %   Basophils Absolute 0.0  0.0 - 0.1 K/uL   BASIC METABOLIC PANEL      Component Value Range   Sodium 138  135 - 145 mEq/L  Potassium 3.8  3.5 - 5.1 mEq/L   Chloride 105  96 - 112 mEq/L   CO2 27  19 - 32 mEq/L   Glucose, Bld 114 (*) 70 - 99 mg/dL   BUN 10  6 - 23 mg/dL   Creatinine, Ser 7.82  0.50 - 1.10 mg/dL   Calcium 9.0  8.4 - 95.6 mg/dL   GFR calc non Af Amer 88 (*) >90 mL/min   GFR calc Af Amer >90  >90 mL/min  URINALYSIS, ROUTINE W REFLEX MICROSCOPIC      Component Value Range   Color, Urine YELLOW  YELLOW   APPearance CLEAR  CLEAR   Specific Gravity, Urine 1.004 (*) 1.005 - 1.030   pH 7.5  5.0 - 8.0   Glucose, UA NEGATIVE  NEGATIVE mg/dL   Hgb urine dipstick NEGATIVE  NEGATIVE   Bilirubin Urine NEGATIVE  NEGATIVE   Ketones, ur NEGATIVE  NEGATIVE mg/dL   Protein, ur NEGATIVE  NEGATIVE mg/dL   Urobilinogen, UA 0.2  0.0 - 1.0 mg/dL   Nitrite NEGATIVE  NEGATIVE   Leukocytes, UA NEGATIVE  NEGATIVE  POCT PREGNANCY, URINE      Component Value Range   Preg Test, Ur NEGATIVE  NEGATIVE       Ct Head Wo Contrast  06/01/2012  *RADIOLOGY REPORT*  Clinical Data: Dizziness for 2 days with nausea and left upper extremity numbness.  CT HEAD WITHOUT CONTRAST  Technique:  Contiguous axial images were obtained from the base of the skull through the vertex without contrast.  Comparison: None.  Findings: The ventricles and sulci are symmetrical without significant effacement, displacement, or dilatation. No mass effect or midline shift. No abnormal extra-axial fluid collections. The grey-white matter junction is distinct. Basal cisterns are not effaced. No acute intracranial hemorrhage. No depressed skull fractures.  Visualized mastoid air cells and paranasal sinuses are not opacified.  IMPRESSION: No acute intracranial abnormalities.   Original Report Authenticated By: Burman Nieves, M.D.      1. Lightheadedness       MDM  9:00 PM patient seen and evaluated. Patient appears well in no acute distress at this time. Patient  has normal nonfocal neuro exam.   Lab tests unremarkable. Unremarkable EKG. CT unremarkable. At this time no signs for acute or emergent cause of patient's ongoing symptoms. Patient has plans to followup with PCP tomorrow or early this week.     Date: 06/01/2012  Rate: 59  Rhythm: normal sinus rhythm  QRS Axis: normal  Intervals: normal  ST/T Wave abnormalities: nonspecific T wave changes  Conduction Disutrbances:none  Narrative Interpretation:   Old EKG Reviewed: none available    Angus Seller, PA 06/02/12 2005

## 2012-06-02 ENCOUNTER — Telehealth: Payer: Self-pay | Admitting: Internal Medicine

## 2012-06-02 ENCOUNTER — Encounter: Payer: Self-pay | Admitting: Family

## 2012-06-02 ENCOUNTER — Ambulatory Visit (INDEPENDENT_AMBULATORY_CARE_PROVIDER_SITE_OTHER): Payer: 59 | Admitting: Family

## 2012-06-02 VITALS — BP 98/60 | HR 66 | Wt 143.0 lb

## 2012-06-02 DIAGNOSIS — R002 Palpitations: Secondary | ICD-10-CM

## 2012-06-02 DIAGNOSIS — R42 Dizziness and giddiness: Secondary | ICD-10-CM

## 2012-06-02 NOTE — Telephone Encounter (Signed)
Patient called stating that she would like to switch pcps from Dr. Cato Mulligan to Dr. Caryl Never for more availability. Please advise.

## 2012-06-02 NOTE — Telephone Encounter (Signed)
[facility] Triage Call Report Triage Record Num: [id number] Operator: April Finney Patient Name: Kari Stephens Call Date & Time: 06/01/2012 6:06:27PM Patient Phone: [phone] PCP: Valetta Mole. [name] Patient Gender: Female PCP Fax : [phone] Patient DOB: 10-Dec-1970 [facility] Name[facility] Reason for Call: Caller: [place]/Patient; PCP: Birdie Sons (Adults only); CB#: [phone]; Call regarding Dizziness; New onset today affecting her balance. She feels like the room is spinning and her hand and part of her arm have been tingling but she is able to move them. No emergent symptoms. See in 4 hrs care advice given. Protocol(s) Used: Dizziness or Vertigo Recommended Outcome per Protocol: See Provider within 4 hours Reason for Outcome: Having sensations of turning or spinning that affects balance AND not responsive to 4 hours of home care Care Advice: Call EMS 911 if patient develops confusion, decreased level of consciousness, chest pain, shortness of breath, or focal neurologic abnormalities such as facial droop or weakness of one extremity. ~ ~ SYMPTOM / CONDITION MANAGEMENT ~ CAUTIONS ~ List, or take, all current prescription(s), nonprescription or alternative medication(s) to provider for evaluation. 02/02/

## 2012-06-02 NOTE — Telephone Encounter (Signed)
ok 

## 2012-06-02 NOTE — Progress Notes (Signed)
Subjective:    Patient ID: Kari Stephens, female    DOB: 05-22-1970, 42 y.o.   MRN: [id number]  HPI 42 year old white female, nonsmoker, is in as an emergency department followup. She was seen in the ED yesterday with dizziness. She had a CT scan, labs, EKG that were all negative. She has had ongoing dizziness x3-4 days that resolved today after awakening from a nap. Episodes of dizziness but worse with turning her head or with going from sitting or standing position. Reports increase in stress over the last one week with her daughter being diagnosed with dyslexia. She consumes about 4 cups of caffeine a day. Denies any sneezing, coughing or congestion. Patient reports a history of palpitations in the past and has had a Holter monitor in place but suggested PVC, nothing significant. However, patient has noticed an increase in the palpitations over the last several days. Denies any chest pain or shortness of breath, nausea or vomiting.   Review of Systems  Constitutional: Negative.   HENT: Negative.   Respiratory: Negative.   Cardiovascular: Negative.   Gastrointestinal: Negative.   Musculoskeletal: Negative.   Skin: Negative.   Neurological: Positive for dizziness.  Hematological: Negative.   Psychiatric/Behavioral: Negative.    Past Medical History  Diagnosis Date  . Asthma with hay fever   . IBS (irritable bowel syndrome)   . IC (interstitial cystitis)   . C. difficile diarrhea   . B12 deficiency   . Lipoma     History   Social History  . Marital Status: Married    Spouse Name: N/A    Number of Children: N/A  . Years of Education: N/A   Occupational History  . Not on file.   Social History Main Topics  . Smoking status: Never Smoker   . Smokeless tobacco: Never Used  . Alcohol Use: Yes  . Drug Use: No  . Sexually Active: Not on file   Other Topics Concern  . Not on file   Social History Narrative  . No narrative on file    Past Surgical History  Procedure  Date  . Dilation and curettage of uterus   . Cesarean section     x 2  . Endometrial polyp removal   . Tubal ligation     Family History  Problem Relation Age of Onset  . Crohn's disease Brother   . Cancer Father     bladder  . Irritable bowel syndrome Brother   . Colon cancer Neg Hx     Allergies  Allergen Reactions  . Sulfa Antibiotics Nausea And Vomiting    Current Outpatient Prescriptions on File Prior to Visit  Medication Sig Dispense Refill  . albuterol (PROVENTIL HFA;VENTOLIN HFA) 108 (90 BASE) MCG/ACT inhaler Inhale 2 puffs into the lungs daily as needed. For shortness of breath or wheezing      . cyanocobalamin (,VITAMIN B-12,) 1000 MCG/ML injection Inject 1,000 mcg into the muscle every 30 (thirty) days. Administered at Dr. Regino Schultze office      . desonide (DESOWEN) 0.05 % lotion Apply 1 application topically daily as needed. Dry skin (ears)      . DimenhyDRINATE (DRAMAMINE PO) Take 1 tablet by mouth once. For nausea and dizziness      . hyoscyamine (LEVSIN SL) 0.125 MG SL tablet Place 0.125 mg under the tongue daily as needed. For ibs symptoms      . Multiple Vitamin (MULTIVITAMIN WITH MINERALS) TABS Take 1 tablet by mouth daily.      Marland Kitchen  Probiotic Product (ALIGN PO) Take 1 capsule by mouth daily as needed. For ibs symptoms       Current Facility-Administered Medications on File Prior to Visit  Medication Dose Route Frequency Provider Last Rate Last Dose  . cyanocobalamin ((VITAMIN B-12)) injection 1,000 mcg  1,000 mcg Intramuscular Q30 days Hart Carwin, MD   1,000 mcg at 05/21/12 1116    BP 98/60  Pulse 66  Wt 143 lb (64.864 kg)  SpO2 98%chart    Objective:   Physical Exam  Constitutional: She is oriented to person, place, and time. She appears well-developed and well-nourished.  Neck: Normal range of motion. Neck supple.  Cardiovascular: Normal rate, regular rhythm and normal heart sounds.   Pulmonary/Chest: Effort normal and breath sounds normal.   Abdominal: Soft. Bowel sounds are normal.  Neurological: She is alert and oriented to person, place, and time. She has normal reflexes.  Skin: Skin is warm and dry.  Psychiatric: She has a normal mood and affect.          Assessment & Plan:  Assessment:  1. Dizziness 2. Palpitations, EKG within normal limits  Plan: Since she's seeing cardiology approximately 3 years ago, we will refer back to cardiology for management. Although she probably doesn't have any underlying heart disease, is probably a good idea to just be sure considering her symptoms and her history. TSH sent to rule out any thyroid abnormality. Has a family history of thyroid dysfunction. Encouraged healthy diet, exercise, caffeine reduction, stress reduction. Will notify patient pending results. Recheck a schedule, and as needed.

## 2012-06-03 NOTE — ED Provider Notes (Signed)
Medical screening examination/treatment/procedure(s) were performed by non-physician practitioner and as supervising physician I was immediately available for consultation/collaboration.  Doug Sou, MD 06/03/12 1332

## 2012-06-05 ENCOUNTER — Encounter: Payer: Self-pay | Admitting: Cardiovascular Disease

## 2012-06-05 ENCOUNTER — Ambulatory Visit (INDEPENDENT_AMBULATORY_CARE_PROVIDER_SITE_OTHER): Payer: PRIVATE HEALTH INSURANCE | Admitting: Cardiovascular Disease

## 2012-06-05 ENCOUNTER — Ambulatory Visit (INDEPENDENT_AMBULATORY_CARE_PROVIDER_SITE_OTHER): Payer: PRIVATE HEALTH INSURANCE

## 2012-06-05 VITALS — BP 110/70 | HR 64 | Ht 63.0 in | Wt 142.0 lb

## 2012-06-05 DIAGNOSIS — R002 Palpitations: Secondary | ICD-10-CM | POA: Insufficient documentation

## 2012-06-05 NOTE — Progress Notes (Signed)
Kari Stephens Date of Birth  [birth year]       [facility]    [place] [address]  [address] [place], [place]  [address]   [place], [place]  [address] [phone]     [phone]   Fax  [phone]    Fax [phone]  Problem List: 1. Palpitations  History of Present Illness:  Kari Stephens is a 42 yo who presents for further evaluation of palpitations, sweats, vertigo, dizziness,.  She was in the ER on Super Bowl Sunday.   She has not been under any stress, no significant viral illness.    He has been under some stress with her daughter.  She exercises regularly - 1 hour a day.  No CP or dyspnea with exercise.   She has been having some hormonal changes recently - night sweats,   She has palpitations - typically in the evening.  The palpitations last for several seconds - several minutes.   They may cause her to be lightheaded - but no syncope.    Current Outpatient Prescriptions on File Prior to Visit  Medication Sig Dispense Refill  . albuterol (PROVENTIL HFA;VENTOLIN HFA) 108 (90 BASE) MCG/ACT inhaler Inhale 2 puffs into the lungs daily as needed. For shortness of breath or wheezing      . cyanocobalamin (,VITAMIN B-12,) 1000 MCG/ML injection Inject 1,000 mcg into the muscle every 30 (thirty) days. Administered at Dr. Regino Schultze office      . desonide (DESOWEN) 0.05 % lotion Apply 1 application topically daily as needed. Dry skin (ears)      . DimenhyDRINATE (DRAMAMINE PO) Take 1 tablet by mouth once. For nausea and dizziness      . hyoscyamine (LEVSIN SL) 0.125 MG SL tablet Place 0.125 mg under the tongue daily as needed. For ibs symptoms      . loratadine (CLARITIN) 10 MG tablet Take 10 mg by mouth daily.      . Multiple Vitamin (MULTIVITAMIN WITH MINERALS) TABS Take 1 tablet by mouth daily.      . Probiotic Product (ALIGN PO) Take 1 capsule by mouth daily as needed. For ibs symptoms       Current Facility-Administered  Medications on File Prior to Visit  Medication Dose Route Frequency Provider Last Rate Last Dose  . cyanocobalamin ((VITAMIN B-12)) injection 1,000 mcg  1,000 mcg Intramuscular Q30 days Hart Carwin, MD   1,000 mcg at 05/21/12 1116    Allergies  Allergen Reactions  . Sulfa Antibiotics Nausea And Vomiting    Past Medical History  Diagnosis Date  . Asthma with hay fever   . IBS (irritable bowel syndrome)   . IC (interstitial cystitis)   . C. difficile diarrhea   . B12 deficiency   . Lipoma     Past Surgical History  Procedure Date  . Dilation and curettage of uterus   . Cesarean section     x 2  . Endometrial polyp removal   . Tubal ligation     History  Smoking status  . Never Smoker   Smokeless tobacco  . Never Used    History  Alcohol Use  . Yes    Family History  Problem Relation Age of Onset  . Crohn's disease Brother   . Cancer Father     bladder  . Irritable bowel syndrome Brother   . Colon cancer Neg Hx     Reviw of Systems:  Reviewed in  the HPI.  All other systems are negative.  Physical Exam: Blood pressure 110/70, pulse 64, height 5\' 3"  (1.6 m), weight 142 lb (64.411 kg). General: Well developed, well nourished, in no acute distress.  Head: Normocephalic, atraumatic, sclera non-icteric, mucus membranes are moist,   Neck: Supple. Carotids are 2 + without bruits. No JVD   Lungs: Clear   Heart: RR, normal S1, S2  Abdomen: Soft, non-tender, non-distended with normal bowel sounds.  Msk:  Strength and tone are normal   Extremities: No clubbing or cyanosis. No edema.  Distal pedal pulses are 2+ and equal    Neuro: CN II - XII intact.  Alert and oriented X 3.   Psych:  Normal   ECG: 06/01/12 : Sinus bradycardia.  No ST or T wave changes.   Assessment / Plan:

## 2012-06-05 NOTE — Patient Instructions (Addendum)
Your physician has recommended that you wear a 48 HOUR  holter monitor. Holter monitors are medical devices that record the heart's electrical activity. Doctors most often use these monitors to diagnose arrhythmias. Arrhythmias are problems with the speed or rhythm of the heartbeat. The monitor is a small, portable device. You can wear one while you do your normal daily activities. This is usually used to diagnose what is causing palpitations/syncope (passing out).  Your physician recommends that you schedule a follow-up appointment in: 3 MONTHS WITH DR [facility]  Your physician recommends that you continue on your current medications as directed. Please refer to the Current Medication list given to you today.

## 2012-06-05 NOTE — Assessment & Plan Note (Signed)
Kari Stephens presents for further evaluation of palpitations. She's also been having lots of hormonal issues including abnormal periods.  I have suggested that she see her GYN for further evaluation of this.  Her TSH is normal.  K is 3.8.  We'll place a 48 hour monitor on her. I've recommended that she drink V8 juice every day. I'll see her again in 3 months for followup visit. She'll call us if she has any further problems.

## 2012-06-05 NOTE — Progress Notes (Signed)
Placed  a 48 hrs monitor on patient and went over instructions on how to use it and when to return it 

## 2012-06-11 NOTE — Telephone Encounter (Signed)
Patient aware.

## 2012-06-11 NOTE — Telephone Encounter (Signed)
OK 

## 2012-06-18 ENCOUNTER — Ambulatory Visit (INDEPENDENT_AMBULATORY_CARE_PROVIDER_SITE_OTHER): Payer: PRIVATE HEALTH INSURANCE | Admitting: Internal Medicine

## 2012-06-18 DIAGNOSIS — E538 Deficiency of other specified B group vitamins: Secondary | ICD-10-CM

## 2012-06-24 ENCOUNTER — Institutional Professional Consult (permissible substitution): Payer: PRIVATE HEALTH INSURANCE | Admitting: Cardiovascular Disease

## 2012-06-24 ENCOUNTER — Institutional Professional Consult (permissible substitution): Payer: PRIVATE HEALTH INSURANCE | Admitting: Cardiology

## 2012-06-26 ENCOUNTER — Ambulatory Visit: Payer: BC Managed Care – PPO

## 2012-06-30 ENCOUNTER — Telehealth: Payer: Self-pay | Admitting: *Deleted

## 2012-06-30 NOTE — Telephone Encounter (Signed)
Needs results from 48 hr holter, Dr Patty Sermons reviewed a couple strips some were sinus brady @53  and some were PSVT @175 . spoke with pt she said she feels anxious with fast HR and some SOB, pt told she will hear back tomorrow from dr/nurse, she agreed to plan.

## 2012-07-01 ENCOUNTER — Other Ambulatory Visit: Payer: Self-pay | Admitting: *Deleted

## 2012-07-01 MED ORDER — PROPRANOLOL HCL 10 MG PO TABS: 10.0000 mg | ORAL_TABLET | Freq: Four times a day (QID) | ORAL | Status: DC | PRN

## 2012-07-01 NOTE — Telephone Encounter (Signed)
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 Pt was taught diff ways to break the rhythm like valsalva and  hard cough. App made for 1 months and will call with questions and concerns.

## 2012-07-01 NOTE — Telephone Encounter (Signed)
See refill note for outcome.

## 2012-07-03 ENCOUNTER — Telehealth: Payer: Self-pay | Admitting: *Deleted

## 2012-07-03 NOTE — Telephone Encounter (Signed)
This was in my personal staff msg box, I will forward this to Dr Elease Hashimoto.

## 2012-07-03 NOTE — Telephone Encounter (Signed)
Message copied by Antony Odea on Thu Jul 03, 2012  5:27 PM ------      Message from: Iverson Alamin C      Created: Thu Jul 03, 2012  2:38 PM       Kari Stephens is not tolerating the propranolol very well.  She is friends with Dr. Gershon Crane ( [facility]) who called me today relaying her concern.              Lets refer her to EP for consideration for ablation.      I couldn't find a tracing to see if we have documented a true episode of SVT but it seems that we did find an episode on a young girl recently.              Thanks       ------

## 2012-07-07 ENCOUNTER — Telehealth: Payer: Self-pay | Admitting: Cardiovascular Disease

## 2012-07-07 DIAGNOSIS — R002 Palpitations: Secondary | ICD-10-CM

## 2012-07-07 NOTE — Telephone Encounter (Signed)
Order placed, staff msg sent for [facility] to contact her and set app.

## 2012-07-07 NOTE — Telephone Encounter (Signed)
Dr Elease Hashimoto was sent a note prior, I will speak to him regarding referral. Pt informed that I don't have a referral yet.

## 2012-07-07 NOTE — Telephone Encounter (Signed)
Pt states she was to get a referral , has not heard from [place] yet. Thinks it was for EP, if so wants allred

## 2012-07-15 ENCOUNTER — Telehealth: Payer: Self-pay | Admitting: Cardiovascular Disease

## 2012-07-15 NOTE — Telephone Encounter (Signed)
New Problem:    Patient called in with a some questions about the billing for her last visit.  Please call back.

## 2012-07-15 NOTE — Telephone Encounter (Signed)
msg left on cell that she can call [phone] with billing questions.

## 2012-07-28 ENCOUNTER — Telehealth: Payer: Self-pay | Admitting: Internal Medicine

## 2012-07-28 NOTE — Telephone Encounter (Signed)
Pt called to find out what to expect when he comes to the EP appointment with Dr. Johney Frame. On 08/01/12. Pt wants to know  If MD  is going to order any tests  the same day. Pt is aware that if Md orders tests it won't be done the same day unless it needs to be done right away due to any symptoms she is having. Pt also is aware to specter to be hers for more than an hour, just in case there are nay delays. Pt verbalized understanding.

## 2012-07-28 NOTE — Telephone Encounter (Signed)
New problem    New pt coming in on 08/01/12 & wants to discuss what to expect and what she may need to bring to 1st appt

## 2012-08-01 ENCOUNTER — Ambulatory Visit (INDEPENDENT_AMBULATORY_CARE_PROVIDER_SITE_OTHER): Payer: PRIVATE HEALTH INSURANCE | Admitting: Internal Medicine

## 2012-08-01 ENCOUNTER — Encounter: Payer: Self-pay | Admitting: Internal Medicine

## 2012-08-01 VITALS — BP 104/79 | HR 73 | Ht 63.0 in | Wt 141.2 lb

## 2012-08-01 DIAGNOSIS — I471 Supraventricular tachycardia, unspecified: Secondary | ICD-10-CM

## 2012-08-01 DIAGNOSIS — I498 Other specified cardiac arrhythmias: Secondary | ICD-10-CM

## 2012-08-01 DIAGNOSIS — R002 Palpitations: Secondary | ICD-10-CM

## 2012-08-01 NOTE — Progress Notes (Signed)
Primary Care Physician: Kristian Covey, MD Referring Physician:  Dr Zigmund Daniel KASARAH Kari Stephens is a 42 y.o. female with a h/o palpitations who presents today for EP consultation.  She reports having palpitations since college.  These are typically gradual in onset/ offset but at times occur abruptly.  She finds that they occur when lying on her right side at night.  She denies palpitations with exercise and actually finds that if she goes for a run that her symptoms may improve.  She reports at times associated dizziness.  She does not drink heavy ETOh and has recently decreased her caffeine intake.  She does not feel that she has significant stress.  She had an episode of significant nausea with "heart racing" on Super Bowl Sunday.  She went to the ER and her workup was unrevealing.  She appears to have been in sinus rhythm at that time.  She was subsequently evaluated by Dr Elease Hashimoto and had a 48 hour holter monitor placed.  This revealed sinus rhythm.  She had one episode of long RP tachycardia 170s bpm range at 9:43 am.  This tachycardia resembles sinus.  She reports that she was kick boxing at that time.  The monitor was otherwise unrevealing.  She had a workup for similar symptoms in 2011 by Dr Elease Hashimoto.  Echo revealed no significant structural abnormalities at that time.  Today, she denies symptoms of chest pain, shortness of breath, orthopnea, PND, lower extremity edema, dizziness, presyncope, syncope, or neurologic sequela. The patient is tolerating medications without difficulties and is otherwise without complaint today.   Past Medical History  Diagnosis Date  . Asthma with hay fever   . IBS (irritable bowel syndrome)   . IC (interstitial cystitis)     controlled  . C. difficile diarrhea     during pregnancy, resolved  . B12 deficiency   . Lipoma    Past Surgical History  Procedure Laterality Date  . Dilation and curettage of uterus    . Cesarean section      x 2  . Endometrial  polyp removal    . Tubal ligation      Current Outpatient Prescriptions  Medication Sig Dispense Refill  . albuterol (PROVENTIL HFA;VENTOLIN HFA) 108 (90 BASE) MCG/ACT inhaler Inhale 2 puffs into the lungs daily as needed. For shortness of breath or wheezing      . cyanocobalamin (,VITAMIN B-12,) 1000 MCG/ML injection Inject 1,000 mcg into the muscle every 30 (thirty) days. Administered at Dr. Regino Schultze office      . desonide (DESOWEN) 0.05 % lotion Apply 1 application topically daily as needed. Dry skin (ears)      . hyoscyamine (LEVSIN SL) 0.125 MG SL tablet Place 0.125 mg under the tongue daily as needed. For ibs symptoms      . loratadine (CLARITIN) 10 MG tablet Take 10 mg by mouth daily.      . Multiple Vitamin (MULTIVITAMIN WITH MINERALS) TABS Take 1 tablet by mouth daily.      . Probiotic Product (ALIGN PO) Take 1 capsule by mouth daily as needed. For ibs symptoms      . propranolol (INDERAL) 10 MG tablet Take 1 tablet (10 mg total) by mouth 4 (four) times daily as needed (for palpitations).  30 tablet  6  . DimenhyDRINATE (DRAMAMINE PO) Take 1 tablet by mouth once. For nausea and dizziness       Current Facility-Administered Medications  Medication Dose Route Frequency Provider Last Rate Last Dose  .  cyanocobalamin ((VITAMIN B-12)) injection 1,000 mcg  1,000 mcg Intramuscular Q30 days Hart Carwin, MD   1,000 mcg at 06/18/12 1610    Allergies  Allergen Reactions  . Sulfa Antibiotics Nausea And Vomiting    History   Social History  . Marital Status: Married    Spouse Name: N/A    Number of Children: N/A  . Years of Education: N/A   Occupational History  . Not on file.   Social History Main Topics  . Smoking status: Never Smoker   . Smokeless tobacco: Never Used  . Alcohol Use: Yes     Comment: 1 glass of wine most days  . Drug Use: No  . Sexually Active: Not on file   Other Topics Concern  . Not on file   Social History Narrative   Lives in [place].      Proficient Surveyor, mining.   Lives with spouse and 2 children (ages 47 and 3)    Family History  Problem Relation Age of Onset  . Crohn's disease Brother   . Cancer Father     bladder  . Irritable bowel syndrome Brother   . Colon cancer Neg Hx   . Irregular heart beat Cousin     resolved  . Supraventricular tachycardia Cousin     s/p ablation    ROS- All systems are reviewed and negative except as per the HPI above  Physical Exam: Filed Vitals:   08/01/12 1545  BP: 104/79  Pulse: 73  Height: 5\' 3"  (1.6 m)  Weight: 141 lb 3.2 oz (64.048 kg)    GEN- The patient is well appearing, alert and oriented x 3 today.   Head- normocephalic, atraumatic Eyes-  Sclera clear, conjunctiva pink Ears- hearing intact Oropharynx- clear Neck- supple, no JVP Lymph- no cervical lymphadenopathy Lungs- Clear to ausculation bilaterally, normal work of breathing Heart- Regular rate and rhythm, no murmurs, rubs or gallops, PMI not laterally displaced GI- soft, NT, ND, + BS Extremities- no clubbing, cyanosis, or edema MS- no significant deformity or atrophy Skin- no rash or lesion Psych- euthymic mood, full affect Neuro- strength and sensation are intact  EKG 06/05/12 reveals sinus rhythm, PR 160, QRS 88, QTc 460, otherwise normal ekg Echo 08/30/09 reviewed, RV/LV function are normal, mild TR/MR  Assessment and Plan:  1. Palpitations The patient presents for EP consultation.  Her symptoms are not straight forward and not completely consistent with SVT.  She has not had prior arrhythmias documented.  Today we discussed options for EP study, 30 day event monitor, or implantable loop recorder.  Given the frequent nature of her symptoms, I think that a 30 days event recorder should provide sufficient workup at this time.  If unrevealing, then no further EP workup is planned. Lifestyle modification including avoidance of ETOh, excessive caffeine, and stress reduction were discussed at  length. She will return in 6 weeks.  If her EP workup is benign then I will likely return her care to Dr Elease Hashimoto at that time. I discussed the above and reviewed the patients holter results in detail with Dr Elease Hashimoto today in the office.  He is in agreement with this plan.

## 2012-08-01 NOTE — Patient Instructions (Addendum)
Your physician recommends that you schedule a follow-up appointment in: 6 weeks with Dr Allred  Your physician has recommended that you wear an event monitor. Event monitors are medical devices that record the heart's electrical activity. Doctors most often us these monitors to diagnose arrhythmias. Arrhythmias are problems with the speed or rhythm of the heartbeat. The monitor is a small, portable device. You can wear one while you do your normal daily activities. This is usually used to diagnose what is causing palpitations/syncope (passing out).    

## 2012-08-02 ENCOUNTER — Encounter: Payer: Self-pay | Admitting: Internal Medicine

## 2012-08-04 ENCOUNTER — Encounter (INDEPENDENT_AMBULATORY_CARE_PROVIDER_SITE_OTHER): Payer: 59

## 2012-08-04 ENCOUNTER — Telehealth: Payer: Self-pay | Admitting: *Deleted

## 2012-08-04 DIAGNOSIS — I498 Other specified cardiac arrhythmias: Secondary | ICD-10-CM

## 2012-08-04 DIAGNOSIS — R002 Palpitations: Secondary | ICD-10-CM

## 2012-08-04 NOTE — Telephone Encounter (Signed)
Pt enrolled for 30 day event monitor to be mailed to home address. 08/04/12

## 2012-08-07 ENCOUNTER — Encounter: Payer: Self-pay | Admitting: Internal Medicine

## 2012-08-28 ENCOUNTER — Ambulatory Visit: Payer: PRIVATE HEALTH INSURANCE

## 2012-08-28 ENCOUNTER — Telehealth: Payer: Self-pay | Admitting: *Deleted

## 2012-08-28 NOTE — Telephone Encounter (Signed)
Message copied by Daphine Deutscher on Thu Aug 28, 2012  8:12 AM ------      Message from: Daphine Deutscher      Created: Tue Mar 04, 2012 11:15 AM       Call and remind B12 6 month recheck due on 09/01/12 DB ------

## 2012-08-28 NOTE — Telephone Encounter (Signed)
Patient states she is not sure the labs are necessary since she is not getting B 12 injections. States she got a groupon for B 12 and wonders why we cannot get it if others can. Told patient that all I know is there is a shortage and we do not have any. Patient states she may come for labs.

## 2012-08-28 NOTE — Telephone Encounter (Signed)
Left a message for patient to call me. 

## 2012-09-02 ENCOUNTER — Ambulatory Visit
Admission: RE | Admit: 2012-09-02 | Discharge: 2012-09-02 | Disposition: A | Discharge status: Home / Self Care | Payer: PRIVATE HEALTH INSURANCE | Source: Ambulatory Visit | Attending: Obstetrics and Gynecology | Admitting: Obstetrics and Gynecology

## 2012-09-02 DIAGNOSIS — Z1231 Encounter for screening mammogram for malignant neoplasm of breast: Secondary | ICD-10-CM

## 2012-09-03 ENCOUNTER — Telehealth: Payer: Self-pay | Admitting: Internal Medicine

## 2012-09-03 MED ORDER — PRAMOXINE-HC 1-1 % EX CREA: TOPICAL_CREAM | CUTANEOUS | Status: DC

## 2012-09-03 NOTE — Telephone Encounter (Signed)
Hydrocortisone cream 1%, 15 gm, apply tid prn rectal irritation

## 2012-09-03 NOTE — Telephone Encounter (Signed)
Patient would like to ask you a quick question.  I could not get her to elaborate.  Sorry

## 2012-09-03 NOTE — Telephone Encounter (Signed)
Rx sent to [facility] per patient request.

## 2012-09-03 NOTE — Telephone Encounter (Signed)
Spoke with patient and she states she had an IBS flare for a week. This caused her hemorrhoids to itch and burn. She is asking for rx for cream . Please, advise.

## 2012-09-03 NOTE — Telephone Encounter (Signed)
Left a message for patient to call me. 

## 2012-09-08 ENCOUNTER — Ambulatory Visit: Payer: PRIVATE HEALTH INSURANCE | Admitting: Cardiovascular Disease

## 2012-09-11 ENCOUNTER — Ambulatory Visit: Payer: PRIVATE HEALTH INSURANCE | Admitting: Cardiovascular Disease

## 2012-09-15 ENCOUNTER — Encounter: Payer: Self-pay | Admitting: Internal Medicine

## 2012-09-15 ENCOUNTER — Ambulatory Visit (INDEPENDENT_AMBULATORY_CARE_PROVIDER_SITE_OTHER): Payer: PRIVATE HEALTH INSURANCE | Admitting: Internal Medicine

## 2012-09-15 VITALS — BP 99/67 | HR 68 | Ht 63.0 in | Wt 141.6 lb

## 2012-09-15 DIAGNOSIS — R002 Palpitations: Secondary | ICD-10-CM

## 2012-09-15 NOTE — Patient Instructions (Signed)
Your physician recommends that you schedule a follow-up appointment as needed  

## 2012-09-15 NOTE — Assessment & Plan Note (Signed)
Doing well, without significant palpitations Her recent event monitor revealed sinus rhythm with sinus tach during exercise.  No arrhythmias were recorded.  At this time, she will continue to follow-up with Dr Elease Hashimoto for routine follow-up.  Lifestyle modification including avoidance of stress and ETOH/caffeine in moderation. She will contact my office if her symptoms worsen.

## 2012-09-15 NOTE — Progress Notes (Signed)
PCP: Kristian Covey, MD Primary Cardiologist:  Dr Zigmund Daniel Kari Stephens is a 42 y.o. female who presents today for routine electrophysiology followup.  Since last being seen in our clinic, the patient reports doing very well.  Today, she denies symptoms of palpitations, chest pain, shortness of breath,  lower extremity edema, dizziness, presyncope, or syncope.  The patient is otherwise without complaint today.   Past Medical History  Diagnosis Date  . Asthma with hay fever   . IBS (irritable bowel syndrome)   . IC (interstitial cystitis)     controlled  . C. difficile diarrhea     during pregnancy, resolved  . B12 deficiency   . Lipoma    Past Surgical History  Procedure Laterality Date  . Dilation and curettage of uterus    . Cesarean section      x 2  . Endometrial polyp removal    . Tubal ligation      Current Outpatient Prescriptions  Medication Sig Dispense Refill  . albuterol (PROVENTIL HFA;VENTOLIN HFA) 108 (90 BASE) MCG/ACT inhaler Inhale 2 puffs into the lungs daily as needed. For shortness of breath or wheezing      . cyanocobalamin (,VITAMIN B-12,) 1000 MCG/ML injection Inject 1,000 mcg into the muscle every 30 (thirty) days. Administered at Dr. Regino Schultze office      . desonide (DESOWEN) 0.05 % lotion Apply 1 application topically daily as needed. Dry skin (ears)      . DimenhyDRINATE (DRAMAMINE PO) Take 1 tablet by mouth once. For nausea and dizziness      . hyoscyamine (LEVSIN SL) 0.125 MG SL tablet Place 0.125 mg under the tongue daily as needed. For ibs symptoms      . loratadine (CLARITIN) 10 MG tablet Take 10 mg by mouth daily.      . Multiple Vitamin (MULTIVITAMIN WITH MINERALS) TABS Take 1 tablet by mouth daily.      . pramoxine-hydrocortisone (ANALPRAM HC) cream Use TID prn rectal irritation  30 g  0  . Probiotic Product (ALIGN PO) Take 1 capsule by mouth daily as needed. For ibs symptoms      . propranolol (INDERAL) 10 MG tablet Take 1 tablet (10 mg  total) by mouth 4 (four) times daily as needed (for palpitations).  30 tablet  6   Current Facility-Administered Medications  Medication Dose Route Frequency Provider Last Rate Last Dose  . cyanocobalamin ((VITAMIN B-12)) injection 1,000 mcg  1,000 mcg Intramuscular Q30 days Hart Carwin, MD   1,000 mcg at 06/18/12 9147    Physical Exam: Filed Vitals:   09/15/12 1035  BP: 99/67  Pulse: 68  Height: 5\' 3"  (1.6 m)  Weight: 141 lb 9.6 oz (64.229 kg)    GEN- The patient is well appearing, alert and oriented x 3 today.   Head- normocephalic, atraumatic Eyes-  Sclera clear, conjunctiva pink Ears- hearing intact Oropharynx- clear Lungs- Clear to ausculation bilaterally, normal work of breathing Heart- Regular rate and rhythm, no murmurs, rubs or gallops, PMI not laterally displaced GI- soft, NT, ND, + BS Extremities- no clubbing, cyanosis, or edema   Assessment and Plan:

## 2012-10-22 ENCOUNTER — Telehealth: Payer: Self-pay | Admitting: Internal Medicine

## 2012-10-22 MED ORDER — HYOSCYAMINE SULFATE 0.125 MG SL SUBL: SUBLINGUAL_TABLET | SUBLINGUAL | Status: DC

## 2012-10-22 NOTE — Telephone Encounter (Signed)
rx sent

## 2012-11-24 ENCOUNTER — Other Ambulatory Visit (INDEPENDENT_AMBULATORY_CARE_PROVIDER_SITE_OTHER): Payer: PRIVATE HEALTH INSURANCE

## 2012-11-24 DIAGNOSIS — E538 Deficiency of other specified B group vitamins: Secondary | ICD-10-CM

## 2012-12-01 ENCOUNTER — Telehealth: Payer: Self-pay | Admitting: *Deleted

## 2012-12-01 ENCOUNTER — Telehealth: Payer: Self-pay | Admitting: Internal Medicine

## 2012-12-01 ENCOUNTER — Ambulatory Visit (INDEPENDENT_AMBULATORY_CARE_PROVIDER_SITE_OTHER): Payer: 59 | Admitting: Gastroenterology

## 2012-12-01 DIAGNOSIS — E538 Deficiency of other specified B group vitamins: Secondary | ICD-10-CM

## 2012-12-01 MED ORDER — CYANOCOBALAMIN 1000 MCG/ML IJ SOLN: INTRAMUSCULAR | Status: DC

## 2012-12-01 MED ORDER — CYANOCOBALAMIN 1000 MCG/ML IJ SOLN
1000.0000 ug | Freq: Once | INTRAMUSCULAR | Status: AC
  Administered 2012-12-01: 1000 ug via INTRAMUSCULAR

## 2012-12-01 NOTE — Patient Instructions (Signed)
Patient is a Psychologist, counselling patient

## 2012-12-01 NOTE — Telephone Encounter (Signed)
I had left a message earlier for pt informing her of B12 results. Pt called back to state she hasn't been able to get her injections because we haven't had any. Explained to pt we have been sending orders to a pt's PCP or sometimes we can order at a pt's pharmacy and then we give the med. Called pt's Pharmacy who had B12 for injection and she will bring it here for administration.

## 2012-12-01 NOTE — Telephone Encounter (Signed)
Notes Recorded by Hart Carwin, MD on 11/25/2012 at 1:35 PM Please call pt with low normal B12 leveles, continue B12, recheck in 6 months lmom informing pt of results. Reminder in to recheck in 6 months

## 2012-12-02 ENCOUNTER — Telehealth: Payer: Self-pay | Admitting: Internal Medicine

## 2012-12-02 MED ORDER — HYOSCYAMINE SULFATE 0.125 MG SL SUBL: SUBLINGUAL_TABLET | SUBLINGUAL | Status: DC

## 2012-12-02 NOTE — Telephone Encounter (Signed)
rx sent

## 2012-12-18 ENCOUNTER — Encounter (HOSPITAL_COMMUNITY): Payer: Self-pay | Admitting: *Deleted

## 2012-12-18 ENCOUNTER — Other Ambulatory Visit: Payer: Self-pay | Admitting: Obstetrics and Gynecology

## 2012-12-22 ENCOUNTER — Encounter (HOSPITAL_COMMUNITY): Payer: Self-pay

## 2012-12-23 ENCOUNTER — Encounter (HOSPITAL_COMMUNITY): Payer: Self-pay | Admitting: Anesthesiology

## 2012-12-23 ENCOUNTER — Ambulatory Visit (HOSPITAL_COMMUNITY)
Admission: RE | Admit: 2012-12-23 | Discharge: 2012-12-23 | Disposition: A | Discharge status: Home / Self Care | Payer: 59 | Source: Ambulatory Visit | Attending: Obstetrics and Gynecology | Admitting: Obstetrics and Gynecology

## 2012-12-23 ENCOUNTER — Encounter (HOSPITAL_COMMUNITY)
Admission: RE | Disposition: A | Discharge status: Home / Self Care | Payer: Self-pay | Source: Ambulatory Visit | Attending: Obstetrics and Gynecology

## 2012-12-23 ENCOUNTER — Ambulatory Visit (HOSPITAL_COMMUNITY): Payer: 59 | Admitting: Anesthesiology

## 2012-12-23 ENCOUNTER — Encounter (HOSPITAL_COMMUNITY): Payer: Self-pay

## 2012-12-23 DIAGNOSIS — R9389 Abnormal findings on diagnostic imaging of other specified body structures: Secondary | ICD-10-CM | POA: Insufficient documentation

## 2012-12-23 DIAGNOSIS — N920 Excessive and frequent menstruation with regular cycle: Secondary | ICD-10-CM | POA: Insufficient documentation

## 2012-12-23 DIAGNOSIS — Z9889 Other specified postprocedural states: Secondary | ICD-10-CM

## 2012-12-23 DIAGNOSIS — N840 Polyp of corpus uteri: Secondary | ICD-10-CM | POA: Insufficient documentation

## 2012-12-23 HISTORY — DX: Anemia, unspecified: D64.9

## 2012-12-23 HISTORY — DX: Other seasonal allergic rhinitis: J30.2

## 2012-12-23 HISTORY — PX: DILITATION & CURRETTAGE/HYSTROSCOPY WITH NOVASURE ABLATION: SHX5568

## 2012-12-23 LAB — CBC
MCV: 87.9 fL (ref 78.0–100.0)
Platelets: 167 10*3/uL (ref 150–400)
RBC: 4.7 MIL/uL (ref 3.87–5.11)
RDW: 12.3 % (ref 11.5–15.5)
WBC: 5.1 10*3/uL (ref 4.0–10.5)

## 2012-12-23 SURGERY — DILATATION & CURETTAGE/HYSTEROSCOPY WITH NOVASURE ABLATION
Anesthesia: General | Site: Vagina | Wound class: Clean Contaminated

## 2012-12-23 MED ORDER — LACTATED RINGERS IV SOLN
INTRAVENOUS | Status: DC
  Administered 2012-12-23: 125 mL/h via INTRAVENOUS
  Administered 2012-12-23: 13:00:00 via INTRAVENOUS

## 2012-12-23 MED ORDER — DEXAMETHASONE SODIUM PHOSPHATE 4 MG/ML IJ SOLN
INTRAMUSCULAR | Status: DC | PRN
  Administered 2012-12-23: 4 mg via INTRAVENOUS

## 2012-12-23 MED ORDER — MIDAZOLAM HCL 2 MG/2ML IJ SOLN
INTRAMUSCULAR | Status: AC
  Filled 2012-12-23: qty 2

## 2012-12-23 MED ORDER — ONDANSETRON HCL 4 MG/2ML IJ SOLN
INTRAMUSCULAR | Status: DC | PRN
  Administered 2012-12-23: 4 mg via INTRAVENOUS

## 2012-12-23 MED ORDER — MEPERIDINE HCL 25 MG/ML IJ SOLN: 6.2500 mg | INTRAMUSCULAR | Status: DC | PRN

## 2012-12-23 MED ORDER — ONDANSETRON HCL 4 MG/2ML IJ SOLN
INTRAMUSCULAR | Status: AC
  Filled 2012-12-23: qty 2

## 2012-12-23 MED ORDER — KETOROLAC TROMETHAMINE 60 MG/2ML IM SOLN
INTRAMUSCULAR | Status: DC | PRN
  Administered 2012-12-23: 30 mg via INTRAMUSCULAR

## 2012-12-23 MED ORDER — HYDROCODONE-ACETAMINOPHEN 5-300 MG PO TABS: 1.0000 | ORAL_TABLET | Freq: Four times a day (QID) | ORAL | Status: DC | PRN

## 2012-12-23 MED ORDER — CHLOROPROCAINE HCL 1 % IJ SOLN
INTRAMUSCULAR | Status: DC | PRN
  Administered 2012-12-23: 30 mL

## 2012-12-23 MED ORDER — PROPOFOL 10 MG/ML IV BOLUS
INTRAVENOUS | Status: DC | PRN
  Administered 2012-12-23: 150 mg via INTRAVENOUS
  Administered 2012-12-23: 50 mg via INTRAVENOUS

## 2012-12-23 MED ORDER — KETOROLAC TROMETHAMINE 30 MG/ML IJ SOLN: 15.0000 mg | Freq: Once | INTRAMUSCULAR | Status: DC | PRN

## 2012-12-23 MED ORDER — DEXAMETHASONE SODIUM PHOSPHATE 10 MG/ML IJ SOLN
INTRAMUSCULAR | Status: AC
  Filled 2012-12-23: qty 1

## 2012-12-23 MED ORDER — FENTANYL CITRATE 0.05 MG/ML IJ SOLN
INTRAMUSCULAR | Status: DC | PRN
  Administered 2012-12-23: 25 ug via INTRAVENOUS
  Administered 2012-12-23: 100 ug via INTRAVENOUS

## 2012-12-23 MED ORDER — LACTATED RINGERS IV SOLN
INTRAVENOUS | Status: DC | PRN
  Administered 2012-12-23: 3000 mL via INTRAUTERINE

## 2012-12-23 MED ORDER — ONDANSETRON HCL 4 MG/2ML IJ SOLN: 4.0000 mg | Freq: Once | INTRAMUSCULAR | Status: DC | PRN

## 2012-12-23 MED ORDER — FENTANYL CITRATE 0.05 MG/ML IJ SOLN: 25.0000 ug | INTRAMUSCULAR | Status: DC | PRN

## 2012-12-23 MED ORDER — KETOROLAC TROMETHAMINE 30 MG/ML IJ SOLN
INTRAMUSCULAR | Status: AC
  Filled 2012-12-23: qty 2

## 2012-12-23 MED ORDER — FENTANYL CITRATE 0.05 MG/ML IJ SOLN
INTRAMUSCULAR | Status: AC
  Filled 2012-12-23: qty 5

## 2012-12-23 MED ORDER — PROPOFOL 10 MG/ML IV EMUL
INTRAVENOUS | Status: AC
  Filled 2012-12-23: qty 20

## 2012-12-23 MED ORDER — MIDAZOLAM HCL 5 MG/5ML IJ SOLN
INTRAMUSCULAR | Status: DC | PRN
  Administered 2012-12-23: 2 mg via INTRAVENOUS

## 2012-12-23 MED ORDER — LIDOCAINE HCL (CARDIAC) 20 MG/ML IV SOLN
INTRAVENOUS | Status: AC
  Filled 2012-12-23: qty 5

## 2012-12-23 MED ORDER — KETOROLAC TROMETHAMINE 30 MG/ML IJ SOLN
INTRAMUSCULAR | Status: DC | PRN
  Administered 2012-12-23: 30 mg via INTRAVENOUS

## 2012-12-23 MED ORDER — LIDOCAINE HCL (CARDIAC) 20 MG/ML IV SOLN
INTRAVENOUS | Status: DC | PRN
  Administered 2012-12-23: 60 mg via INTRAVENOUS

## 2012-12-23 MED ORDER — IBUPROFEN 800 MG PO TABS: 800.0000 mg | ORAL_TABLET | Freq: Three times a day (TID) | ORAL | Status: DC | PRN

## 2012-12-23 MED ORDER — CHLOROPROCAINE HCL 1 % IJ SOLN
INTRAMUSCULAR | Status: AC
  Filled 2012-12-23: qty 30

## 2012-12-23 SURGICAL SUPPLY — 13 items
ABLATOR ENDOMETRIAL BIPOLAR (ABLATOR) ×2 IMPLANT
CATH ROBINSON RED A/P 16FR (CATHETERS) ×2 IMPLANT
CLOTH BEACON ORANGE TIMEOUT ST (SAFETY) ×2 IMPLANT
CONTAINER PREFILL 10% NBF 60ML (FORM) ×2 IMPLANT
DRESSING TELFA 8X3 (GAUZE/BANDAGES/DRESSINGS) ×2 IMPLANT
GLOVE BIO SURGEON STRL SZ 6.5 (GLOVE) ×2 IMPLANT
GLOVE BIOGEL PI IND STRL 7.0 (GLOVE) ×1 IMPLANT
GLOVE BIOGEL PI INDICATOR 7.0 (GLOVE) ×1
GOWN STRL REIN XL XLG (GOWN DISPOSABLE) ×4 IMPLANT
PACK HYSTEROSCOPY LF (CUSTOM PROCEDURE TRAY) ×2 IMPLANT
PAD OB MATERNITY 4.3X12.25 (PERSONAL CARE ITEMS) ×2 IMPLANT
TOWEL OR 17X24 6PK STRL BLUE (TOWEL DISPOSABLE) ×4 IMPLANT
WATER STERILE IRR 1000ML POUR (IV SOLUTION) ×2 IMPLANT

## 2012-12-23 NOTE — Anesthesia Procedure Notes (Signed)
Procedure Name: LMA Insertion Date/Time: 12/23/2012 12:10 PM Performed by: Graciela Husbands Pre-anesthesia Checklist: Patient identified, Timeout performed, Emergency Drugs available and Suction available Patient Re-evaluated:Patient Re-evaluated prior to inductionOxygen Delivery Method: Circle system utilized Preoxygenation: Pre-oxygenation with 100% oxygen Intubation Type: IV induction LMA: LMA inserted LMA Size: 4.0 Placement Confirmation: breath sounds checked- equal and bilateral and positive ETCO2 Tube secured with: Tape Dental Injury: Teeth and Oropharynx as per pre-operative assessment

## 2012-12-23 NOTE — H&P (Signed)
Kari Stephens is an 42 y.o. female. G2P2 hx TL presents for dx hysteroscopy, removal of endometrial polyp, novasure endometrial ablation due to heavy cycles. EBX done showed benign endometrium  Pertinent Gynecological History: Menses: heavy flow q month with clots Bleeding: menorrhagia Contraception: tubal ligation DES exposure: denies Blood transfusions: none Sexually transmitted diseases: no past history Previous GYN Procedures: TL.  hysteroscopic resection of endom polyp, d&c  Last mammogram: normal Date: 09/02/2012 Last pap: normal Date: 04/18/12 OB History: G2, P2   Menstrual History:  Patient's last menstrual period was 12/06/2012.    Past Medical History  Diagnosis Date  . IBS (irritable bowel syndrome)   . IC (interstitial cystitis)     controlled  . C. difficile diarrhea     during pregnancy, resolved  . B12 deficiency   . Lipoma   . Seasonal allergies   . Asthma with hay fever     rarely uses inhaler  . Anemia     Past Surgical History  Procedure Laterality Date  . Dilation and curettage of uterus    . Cesarean section      x 2  . Endometrial polyp removal    . Tubal ligation      Family History  Problem Relation Age of Onset  . Crohn's disease Brother   . Cancer Father     bladder  . Irritable bowel syndrome Brother   . Colon cancer Neg Hx   . Irregular heart beat Cousin     resolved  . Supraventricular tachycardia Cousin     s/p ablation    Social History:  reports that she has never smoked. She has never used smokeless tobacco. She reports that she drinks about 4.2 ounces of alcohol per week. She reports that she does not use illicit drugs.  Allergies:  Allergies  Allergen Reactions  . Sulfa Antibiotics Nausea And Vomiting    No prescriptions prior to admission    ROS: she complains of heavy flow  Height 5\' 3"  (1.6 m), weight 64.411 kg (142 lb), last menstrual period 12/06/2012.  PHYSICAL EXAM: General: alert, cooperative and no  distress Resp: clear to auscultation bilaterally Cardio: regular rate and rhythm, S1, S2 normal, no murmur, click, rub or gallop GI: soft, non-tender; bowel sounds normal; no masses,  no organomegaly Extremities: no edema, redness or tenderness in the calves or thighs Pelvic: vulva neg, vagina neg uterus sl enlarged no adnexal mass  No results found for this or any previous visit (from the past 24 hour(s)).  No results found.  Assessment/Plan: Menorrhagia Endometrial mass/polyp  P) dx hysteroscopy, D&C, removal of endometrial polyp, Novasure endometrial ablation Procedure explained. Risk of surgery reviewed including infection, bleeding, uterine perforation, 90% reduction in flow, thermal injury  Shaquella Stamant A 12/23/2012, 6:29 AM

## 2012-12-23 NOTE — Anesthesia Preprocedure Evaluation (Signed)
Anesthesia Evaluation    Reviewed: Allergy & Precautions, H&P , NPO status , Patient's Chart, lab work & pertinent test results  Airway Mallampati: I TM Distance: >3 FB Neck ROM: full    Dental no notable dental hx. (+) Teeth Intact   Pulmonary    Pulmonary exam normal       Cardiovascular negative cardio ROS      Neuro/Psych negative neurological ROS  negative psych ROS   GI/Hepatic negative GI ROS, Neg liver ROS,   Endo/Other  negative endocrine ROS  Renal/GU negative Renal ROS  negative genitourinary   Musculoskeletal   Abdominal Normal abdominal exam  (+)   Peds  Hematology negative hematology ROS (+)   Anesthesia Other Findings   Reproductive/Obstetrics negative OB ROS                           Anesthesia Physical Anesthesia Plan  ASA: II  Anesthesia Plan: General   Post-op Pain Management:    Induction: Intravenous  Airway Management Planned: LMA  Additional Equipment:   Intra-op Plan:   Post-operative Plan:   Informed Consent: I have reviewed the patients History and Physical, chart, labs and discussed the procedure including the risks, benefits and alternatives for the proposed anesthesia with the patient or authorized representative who has indicated his/her understanding and acceptance.     Plan Discussed with: CRNA and Surgeon  Anesthesia Plan Comments:         Anesthesia Quick Evaluation

## 2012-12-23 NOTE — Brief Op Note (Signed)
12/23/2012  1:06 PM  PATIENT:  Kari Stephens  42 y.o. female  PRE-OPERATIVE DIAGNOSIS:  Menorrhagia, Endometrial Polyp    POST-OPERATIVE DIAGNOSIS:  Menorrhagia, endometrial thickening  PROCEDURE:  Diagnostic hysteroscopy, Novasure endometrial ablation  SURGEON:  Surgeon(s) and Role:    * Hamzah Savoca A Kaian Fahs, MD - Primary  PHYSICIAN ASSISTANT:   ASSISTANTS: none   ANESTHESIA:   general and paracervical block  FINDINGS; nl tubal ostia, focal thickening ant wall and left posterior wall, no discrete polyp noted, no endocervical canal lesion  EBL:  Total I/O In: 1100 [I.V.:1100] Out: 10 [Blood:10]  BLOOD ADMINISTERED:none  DRAINS: none   LOCAL MEDICATIONS USED:  OTHER nesicaine  SPECIMEN:  No Specimen  DISPOSITION OF SPECIMEN:  N/A  COUNTS:  YES  TOURNIQUET:  * No tourniquets in log *  DICTATION: .Other Dictation: Dictation Number [id number]  PLAN OF CARE: Discharge to home after PACU  PATIENT DISPOSITION:  PACU - hemodynamically stable.   Delay start of Pharmacological VTE agent (>24hrs) due to surgical blood loss or risk of bleeding: no

## 2012-12-23 NOTE — Transfer of Care (Signed)
Immediate Anesthesia Transfer of Care Note  Patient: Kari Stephens  Procedure(s) Performed: Procedure(s): DILATATION & CURETTAGE/HYSTEROSCOPY WITH NOVASURE ABLATION  (N/A)  Patient Location: PACU  Anesthesia Type:General  Level of Consciousness: awake, alert , oriented and patient cooperative  Airway & Oxygen Therapy: Patient Spontanous Breathing and Patient connected to nasal cannula oxygen  Post-op Assessment: Report given to PACU RN and Post -op Vital signs reviewed and stable  Post vital signs: stable  Complications: No apparent anesthesia complications

## 2012-12-23 NOTE — Anesthesia Postprocedure Evaluation (Signed)
Anesthesia Post Note  Patient: Kari Stephens  Procedure(s) Performed: Procedure(s) (LRB): DILATATION & CURETTAGE/HYSTEROSCOPY WITH NOVASURE ABLATION  (N/A)  Anesthesia type: General  Patient location: PACU  Post pain: Pain level controlled  Post assessment: Post-op Vital signs reviewed  Last Vitals:  Filed Vitals:   12/23/12 1246  BP: 105/73  Pulse:   Temp: 36.8 C  Resp: 20    Post vital signs: Reviewed  Level of consciousness: sedated  Complications: No apparent anesthesia complications

## 2012-12-24 ENCOUNTER — Encounter (HOSPITAL_COMMUNITY): Payer: Self-pay | Admitting: Obstetrics and Gynecology

## 2012-12-24 NOTE — Op Note (Signed)
NAME:  EYANA, STOLZE NO.:  [id number]  MEDICAL RECORD NO.:  [id number]  LOCATION:  [facility]                          FACILITY:  [facility]  PHYSICIAN:  Maxie Better, M.D.DATE OF BIRTH:  02/05/1971  DATE OF PROCEDURE:  12/23/2012 DATE OF DISCHARGE:  12/23/2012                              OPERATIVE REPORT   PREOPERATIVE DIAGNOSES:  Menorrhagia, endometrial mass/endometrial polyp.  PROCEDURES:  Diagnostic hysteroscopy, NovaSure endometrial ablation.  POSTOPERATIVE DIAGNOSES:  Menorrhagia, endometrial thickening.  ANESTHESIA:  General, paracervical block.  SURGEON:  Maxie Better, M.D.  ASSISTANT:  None.  DESCRIPTION OF PROCEDURE:  Under adequate general anesthesia, the patient was placed in the dorsal lithotomy position.  She was sterilely prepped and draped in usual fashion.  The bladder was catheterized for moderate amount of urine.  Examination under anesthesia revealed anteverted uterus.  No adnexal masses could be appreciated.  Bivalve speculum was placed in the vagina.  A 20 mL of 1% Nesacaine was injected paracervically at 3 and 9 o'clock position.  The anterior lip of the cervix was grasped with a single-tooth tenaculum.  The uterus sounded to 10 cm.  The cervix was then serially dilated to #25 [facility] dilator.  An endocervical canal was sounded to 4 cm.  Further dilatation was not performed as a diagnostic hysteroscope was then inserted.  The both tubal ostia could be seen.  There was a focal thickening in the anterior wall of the uterus as well as on the left posterior aspect without any definitive polypoid lesions.  Given the fact that, the patient already had an endometrial biopsy in the office, which was benign.  Decision was then made to proceed with an endometrial ablation.  The cervix was then further dilated up to #31 [facility] dilator.  The NovaSure endometrial ablation apparatus was then inserted.  The cavity was then sounded, uterine width  of 3.7 cm was noted.  Two minutes of ablation at a power of 122 was done.  The ablation apparatus removed.  The diagnostic hysteroscope was then reinserted and good endometrial ablation was noted throughout.  The procedure was then terminated by removing all instruments from the vagina.  SPECIMEN:  None.  ESTIMATED BLOOD LOSS:  Minimal.  COMPLICATION:  None.  The patient tolerated the procedure well, was transferred to recovery in stable condition.     Maxie Better, M.D.     [place]/[facility]  D:  12/23/2012  T:  12/24/2012  Job:  [id number]

## 2013-01-14 ENCOUNTER — Telehealth: Payer: Self-pay | Admitting: Internal Medicine

## 2013-01-14 NOTE — Telephone Encounter (Signed)
Per pharmacy, patient does have 6 refills. Pharmacy will refill rx. Patient advised.

## 2013-01-15 ENCOUNTER — Telehealth: Payer: Self-pay | Admitting: Internal Medicine

## 2013-01-15 NOTE — Telephone Encounter (Signed)
Left a message for patient to call me. 

## 2013-01-16 ENCOUNTER — Ambulatory Visit (INDEPENDENT_AMBULATORY_CARE_PROVIDER_SITE_OTHER): Payer: 59 | Admitting: Internal Medicine

## 2013-01-16 DIAGNOSIS — E538 Deficiency of other specified B group vitamins: Secondary | ICD-10-CM

## 2013-01-16 NOTE — Telephone Encounter (Signed)
Left a message for patient that I have returned her call again.

## 2013-01-19 NOTE — Telephone Encounter (Signed)
Spoke with patient and scheduled on 02/17/13 at 11:30 AM.

## 2013-02-19 ENCOUNTER — Ambulatory Visit (INDEPENDENT_AMBULATORY_CARE_PROVIDER_SITE_OTHER): Payer: 59 | Admitting: Internal Medicine

## 2013-02-19 DIAGNOSIS — E538 Deficiency of other specified B group vitamins: Secondary | ICD-10-CM

## 2013-04-13 ENCOUNTER — Ambulatory Visit: Payer: 59 | Admitting: Internal Medicine

## 2013-04-13 ENCOUNTER — Other Ambulatory Visit (INDEPENDENT_AMBULATORY_CARE_PROVIDER_SITE_OTHER): Payer: 59 | Admitting: *Deleted

## 2013-04-13 DIAGNOSIS — E538 Deficiency of other specified B group vitamins: Secondary | ICD-10-CM

## 2013-04-13 MED ORDER — CYANOCOBALAMIN 1000 MCG/ML IJ SOLN
1000.0000 ug | Freq: Once | INTRAMUSCULAR | Status: AC
  Administered 2013-04-13: 1000 ug via INTRAMUSCULAR

## 2013-05-14 ENCOUNTER — Telehealth: Payer: Self-pay | Admitting: Internal Medicine

## 2013-05-14 ENCOUNTER — Ambulatory Visit (INDEPENDENT_AMBULATORY_CARE_PROVIDER_SITE_OTHER): Payer: BC Managed Care – PPO | Admitting: Internal Medicine

## 2013-05-14 DIAGNOSIS — D518 Other vitamin B12 deficiency anemias: Secondary | ICD-10-CM

## 2013-05-14 MED ORDER — CYANOCOBALAMIN 1000 MCG/ML IJ SOLN
1000.0000 ug | Freq: Once | INTRAMUSCULAR | Status: AC
  Administered 2013-05-14: 1000 ug via INTRAMUSCULAR

## 2013-05-14 MED ORDER — CYANOCOBALAMIN 1000 MCG/ML IJ SOLN: INTRAMUSCULAR | Status: DC

## 2013-05-14 NOTE — Telephone Encounter (Signed)
rx sent. Needs office visit for further refills. We have not seen her in the office since 03/2011.

## 2013-06-01 ENCOUNTER — Telehealth: Payer: Self-pay | Admitting: *Deleted

## 2013-06-01 ENCOUNTER — Other Ambulatory Visit: Payer: Self-pay | Admitting: *Deleted

## 2013-06-01 DIAGNOSIS — E538 Deficiency of other specified B group vitamins: Secondary | ICD-10-CM

## 2013-06-01 NOTE — Telephone Encounter (Signed)
Labs in EPIC. Left a message for patient to call me.

## 2013-06-01 NOTE — Telephone Encounter (Signed)
Message copied by Hulan Saas on Mon Jun 01, 2013  2:27 PM ------      Message from: Lance Morin      Created: Mon Dec 01, 2012 11:30 AM       Notes Recorded by Lafayette Dragon, MD on 11/25/2012 at 1:35 PM      Please call pt with low normal B12 leveles, continue B12, recheck in 6 months ------

## 2013-06-01 NOTE — Telephone Encounter (Signed)
Spoke with patient and she will come for labs. 

## 2013-06-17 ENCOUNTER — Encounter: Payer: Self-pay | Admitting: *Deleted

## 2013-06-18 ENCOUNTER — Other Ambulatory Visit (INDEPENDENT_AMBULATORY_CARE_PROVIDER_SITE_OTHER): Payer: BC Managed Care – PPO

## 2013-06-18 DIAGNOSIS — E538 Deficiency of other specified B group vitamins: Secondary | ICD-10-CM

## 2013-06-18 LAB — VITAMIN B12: Vitamin B-12: 365 pg/mL (ref 211–911)

## 2013-06-22 ENCOUNTER — Other Ambulatory Visit: Payer: Self-pay | Admitting: *Deleted

## 2013-06-22 DIAGNOSIS — E538 Deficiency of other specified B group vitamins: Secondary | ICD-10-CM

## 2013-07-21 ENCOUNTER — Telehealth: Payer: Self-pay | Admitting: Internal Medicine

## 2013-07-21 NOTE — Telephone Encounter (Signed)
Patient calling because she is feeling tired lately. She is asking if she can restart the B12 injections. Please, advise.

## 2013-07-21 NOTE — Telephone Encounter (Signed)
Last B12 low normal. OK to start B12 1000ug IM monthly x6, then repeat B12

## 2013-07-22 MED ORDER — CYANOCOBALAMIN 1000 MCG/ML IJ SOLN: INTRAMUSCULAR | Status: DC

## 2013-07-22 NOTE — Telephone Encounter (Signed)
Rx sent. Left a message for patient to call me. 

## 2013-07-23 ENCOUNTER — Other Ambulatory Visit: Payer: Self-pay | Admitting: *Deleted

## 2013-07-23 DIAGNOSIS — E538 Deficiency of other specified B group vitamins: Secondary | ICD-10-CM

## 2013-07-23 NOTE — Telephone Encounter (Signed)
Patient notified of Dr. Nichola Sizer recommendations. She will call back to schedule first injection.

## 2013-07-29 ENCOUNTER — Ambulatory Visit (INDEPENDENT_AMBULATORY_CARE_PROVIDER_SITE_OTHER): Payer: BC Managed Care – PPO | Admitting: Internal Medicine

## 2013-07-29 DIAGNOSIS — E538 Deficiency of other specified B group vitamins: Secondary | ICD-10-CM

## 2013-08-05 ENCOUNTER — Telehealth: Payer: Self-pay | Admitting: Internal Medicine

## 2013-08-05 ENCOUNTER — Other Ambulatory Visit: Payer: Self-pay

## 2013-08-05 DIAGNOSIS — Z1231 Encounter for screening mammogram for malignant neoplasm of breast: Secondary | ICD-10-CM

## 2013-08-05 MED ORDER — HYDROCORTISONE 1 % EX CREA: TOPICAL_CREAM | CUTANEOUS | Status: DC

## 2013-08-05 NOTE — Telephone Encounter (Signed)
Patient reports hx of hemorrhoids off and on since her children were born. She is having a problems that seems to be getting worse. States her fissure is acting up also. Uncomfortable at night. It is waking her up. Please, advise.

## 2013-08-05 NOTE — Telephone Encounter (Signed)
Please send Hydrocortisone cream 1%, 15 gm, apply bid to rectum,

## 2013-08-05 NOTE — Telephone Encounter (Signed)
Patient notified of recommendations. 

## 2013-08-19 ENCOUNTER — Telehealth: Payer: Self-pay | Admitting: Internal Medicine

## 2013-08-19 NOTE — Telephone Encounter (Signed)
Spoke with patient and per OV note on 04/27/11 she had a low IgA level.

## 2013-09-03 ENCOUNTER — Ambulatory Visit
Admission: RE | Admit: 2013-09-03 | Discharge: 2013-09-03 | Disposition: A | Discharge status: Home / Self Care | Payer: BC Managed Care – PPO | Source: Ambulatory Visit

## 2013-09-03 ENCOUNTER — Encounter (INDEPENDENT_AMBULATORY_CARE_PROVIDER_SITE_OTHER): Payer: Self-pay

## 2013-09-03 DIAGNOSIS — Z1231 Encounter for screening mammogram for malignant neoplasm of breast: Secondary | ICD-10-CM

## 2013-09-04 ENCOUNTER — Other Ambulatory Visit: Payer: Self-pay | Admitting: Obstetrics and Gynecology

## 2013-09-04 DIAGNOSIS — R928 Other abnormal and inconclusive findings on diagnostic imaging of breast: Secondary | ICD-10-CM

## 2013-09-15 ENCOUNTER — Ambulatory Visit
Admission: RE | Admit: 2013-09-15 | Discharge: 2013-09-15 | Disposition: A | Discharge status: Home / Self Care | Payer: BC Managed Care – PPO | Source: Ambulatory Visit | Attending: Obstetrics and Gynecology | Admitting: Obstetrics and Gynecology

## 2013-09-15 DIAGNOSIS — R928 Other abnormal and inconclusive findings on diagnostic imaging of breast: Secondary | ICD-10-CM

## 2013-10-05 ENCOUNTER — Telehealth: Payer: Self-pay | Admitting: Internal Medicine

## 2013-10-05 MED ORDER — CYANOCOBALAMIN 1000 MCG/ML IJ SOLN: INTRAMUSCULAR | Status: DC

## 2013-10-05 NOTE — Telephone Encounter (Signed)
Left message for pt to call back.  Pt is scheduled for injection tomorrow at 9am.

## 2013-10-05 NOTE — Telephone Encounter (Signed)
Patient is set up for her next B12 injection for Tomorrow am at 9:00.  New Rx set

## 2013-10-06 ENCOUNTER — Ambulatory Visit (INDEPENDENT_AMBULATORY_CARE_PROVIDER_SITE_OTHER): Payer: BC Managed Care – PPO | Admitting: Internal Medicine

## 2013-10-06 DIAGNOSIS — E538 Deficiency of other specified B group vitamins: Secondary | ICD-10-CM

## 2013-10-06 MED ORDER — CYANOCOBALAMIN 1000 MCG/ML IJ SOLN
1000.0000 ug | Freq: Once | INTRAMUSCULAR | Status: AC
  Administered 2013-10-06: 1000 ug via INTRAMUSCULAR

## 2013-11-30 ENCOUNTER — Telehealth: Payer: Self-pay | Admitting: Internal Medicine

## 2013-11-30 NOTE — Telephone Encounter (Signed)
Left message for patient to call back. She needs an office visit. She has not been seen since 01/2011. Also, per last b12 lab note, patient was to d/c B12 x 6 months and have rechecked, so B12 refill is not appropriate at this time.

## 2013-12-01 ENCOUNTER — Other Ambulatory Visit: Payer: Self-pay | Admitting: *Deleted

## 2013-12-01 MED ORDER — CYANOCOBALAMIN 1000 MCG/ML IJ SOLN: INTRAMUSCULAR | Status: DC

## 2013-12-01 NOTE — Telephone Encounter (Signed)
I have spoken to patient. She was actually restarted on b12 supplementation. I will sent her a 2 month supply of b12 until her scheduled appointment on 01/12/14. She verbalizes understanding.

## 2013-12-01 NOTE — Telephone Encounter (Signed)
Left message for patient to call back  

## 2013-12-02 ENCOUNTER — Ambulatory Visit: Payer: Self-pay | Admitting: Internal Medicine

## 2013-12-02 DIAGNOSIS — E538 Deficiency of other specified B group vitamins: Secondary | ICD-10-CM

## 2013-12-02 MED ORDER — CYANOCOBALAMIN 1000 MCG/ML IJ SOLN
1000.0000 ug | Freq: Once | INTRAMUSCULAR | Status: AC
  Administered 2013-12-02: 1000 ug via INTRAMUSCULAR

## 2013-12-17 ENCOUNTER — Telehealth: Payer: Self-pay | Admitting: *Deleted

## 2013-12-17 NOTE — Telephone Encounter (Signed)
Spoke with patient and she will come for labs. 

## 2013-12-17 NOTE — Telephone Encounter (Signed)
Message copied by Hulan Saas on Thu Dec 17, 2013  8:24 AM ------      Message from: Hulan Saas      Created: Mon Jun 22, 2013 10:02 AM       Call and remind patient due for B12 for DB on 12/21/13. Lab in EPIC ------

## 2013-12-31 ENCOUNTER — Other Ambulatory Visit (INDEPENDENT_AMBULATORY_CARE_PROVIDER_SITE_OTHER): Payer: BC Managed Care – PPO

## 2013-12-31 ENCOUNTER — Other Ambulatory Visit: Payer: Self-pay | Admitting: *Deleted

## 2013-12-31 DIAGNOSIS — E538 Deficiency of other specified B group vitamins: Secondary | ICD-10-CM

## 2013-12-31 LAB — VITAMIN B12: VITAMIN B 12: 324 pg/mL (ref 211–911)

## 2014-01-01 ENCOUNTER — Ambulatory Visit (INDEPENDENT_AMBULATORY_CARE_PROVIDER_SITE_OTHER): Payer: BC Managed Care – PPO | Admitting: Internal Medicine

## 2014-01-01 DIAGNOSIS — E538 Deficiency of other specified B group vitamins: Secondary | ICD-10-CM

## 2014-01-12 ENCOUNTER — Telehealth: Payer: Self-pay | Admitting: Internal Medicine

## 2014-01-12 ENCOUNTER — Ambulatory Visit: Payer: BC Managed Care – PPO | Admitting: Internal Medicine

## 2014-01-12 NOTE — Telephone Encounter (Signed)
Please charge no show fee.

## 2014-02-23 ENCOUNTER — Telehealth: Payer: Self-pay | Admitting: Internal Medicine

## 2014-02-23 MED ORDER — CYANOCOBALAMIN 1000 MCG/ML IJ SOLN: INTRAMUSCULAR | Status: DC

## 2014-02-23 NOTE — Telephone Encounter (Signed)
Rx for 1 month sent. Patient must have an office visit before any further refills will be given.

## 2014-02-26 ENCOUNTER — Ambulatory Visit (INDEPENDENT_AMBULATORY_CARE_PROVIDER_SITE_OTHER): Payer: BC Managed Care – PPO | Admitting: Internal Medicine

## 2014-02-26 DIAGNOSIS — E538 Deficiency of other specified B group vitamins: Secondary | ICD-10-CM

## 2014-03-30 ENCOUNTER — Encounter: Payer: Self-pay | Admitting: Internal Medicine

## 2014-03-30 ENCOUNTER — Ambulatory Visit (INDEPENDENT_AMBULATORY_CARE_PROVIDER_SITE_OTHER): Payer: BC Managed Care – PPO | Admitting: Internal Medicine

## 2014-03-30 VITALS — BP 110/70 | HR 68 | Ht 64.0 in | Wt 145.8 lb

## 2014-03-30 DIAGNOSIS — K589 Irritable bowel syndrome without diarrhea: Secondary | ICD-10-CM

## 2014-03-30 MED ORDER — HYDROCORTISONE 1 % EX CREA
TOPICAL_CREAM | CUTANEOUS | Status: DC
Start: 2014-03-30 — End: 2015-02-03

## 2014-03-30 MED ORDER — CYANOCOBALAMIN 1000 MCG/ML IJ SOLN: INTRAMUSCULAR | Status: DC

## 2014-03-30 NOTE — Progress Notes (Signed)
TIMIRA BIEDA [birth year] [id number]  Note: This dictation was prepared with Dragon digital system. Any transcriptional errors that result from this procedure are unintentional.   History of Present Illness:  This is a 43 year old white female with diarrhea predominant irritable bowel syndrome. Her last appointment with me was in December 2012. She is on B12 supplements monthly because of borderline low B12 levels. She has been ruled out for sprue but she has lactose intolerance. We have also seen her for rectal pain and anal fissure which she treats with hydrocortisone cream. Her last colonoscopy in August 2011 was normal including the terminal ileum. Her brother has Crohn's disease. Patient had a small bowel follow-through in August 2011 which was negative as well as her IBD markers. Her irritable bowel syndrome has been under good control. She does not take antispasmodics but has been on a strict carb modified low-fat diet which seems to agree with her.    Past Medical History  Diagnosis Date  . IBS (irritable bowel syndrome)   . IC (interstitial cystitis)     controlled  . C. difficile diarrhea     during pregnancy, resolved  . B12 deficiency   . Lipoma   . Seasonal allergies   . Asthma with hay fever     rarely uses inhaler  . Anemia     Past Surgical History  Procedure Laterality Date  . Dilation and curettage of uterus    . Cesarean section      x 2  . Endometrial polyp removal    . Tubal ligation    . Dilitation & currettage/hystroscopy with novasure ablation N/A 12/23/2012    Procedure: DILATATION & CURETTAGE/HYSTEROSCOPY WITH NOVASURE ABLATION ;  Surgeon: [name], MD;  Location: [facility];  Service: Gynecology;  Laterality: N/A;    Allergies  Allergen Reactions  . Sulfa Antibiotics Nausea And Vomiting    Family history and social history have been reviewed.  Review of Systems: Denies rectal bleeding. Positive for occasional rectal pain and  irritation  The remainder of the 10 point ROS is negative except as outlined in the H&P  Physical Exam: General Appearance Well developed, in no distress Eyes  Non icteric  HEENT  Non traumatic, normocephalic  Mouth No lesion, tongue papillated, no cheilosis Neck Supple without adenopathy, thyroid not enlarged, no carotid bruits, no JVD Lungs Clear to auscultation bilaterally COR Normal S1, normal S2, regular rhythm, no murmur, quiet precordium Abdomen soft with minimal tenderness. Normoactive bowel sounds. No distention Rectal small skin tag at the rectovaginal wall. Normal rectal sphincter tone. Mild tenderness on digital exam at 9:00. No visible fissure. Stool is Hemoccult negative Extremities  No pedal edema Skin No lesions Neurological Alert and oriented x 3 Psychological Normal mood and affect  Assessment and Plan:   Problem #73 43 year old white female with diarrhea predominant irritable bowel syndromeunder good control with dietary modifications.  Problem #2 Lactose intolerance under good control by avoiding lactose.  Problem #3 History of C. difficile colitis, not active.  Problem #4 Borderline low B12 levels despite B12 supplements. We will have her to continue monthly injections. Her last level was 324 g.    Delfin Edis 03/30/2014

## 2014-03-30 NOTE — Patient Instructions (Addendum)
We have sent the following medications to your pharmacy for you to pick up at your convenience: B12 (you will need labwork again in 1 year) hydrocortisone  CC: Dr Elease Hashimoto

## 2014-03-31 ENCOUNTER — Ambulatory Visit (INDEPENDENT_AMBULATORY_CARE_PROVIDER_SITE_OTHER): Payer: BC Managed Care – PPO | Admitting: Internal Medicine

## 2014-03-31 DIAGNOSIS — E538 Deficiency of other specified B group vitamins: Secondary | ICD-10-CM

## 2014-04-26 ENCOUNTER — Telehealth: Payer: Self-pay | Admitting: Internal Medicine

## 2014-04-26 NOTE — Telephone Encounter (Signed)
I have spoken to patient who states the pharmacy has no record of rx's being faxed. Per Juliann Pulse at [facility], Rx for b12 was picked up on 03/31/15. She states that since the hydrocortisone was 1% cream can be gotten OTC, they probably just erased the rx instead of running it through insurance. I have advised patient of this.

## 2014-04-26 NOTE — Telephone Encounter (Signed)
Rx's were both sent to pharmacy on 03/30/14 and receipt was confirmed by pharmacy. If she did not attempt to pick up until the last few days, the pharmacy may have placed on hold. She needs to ask the pharmacy to fill these. Left message for patient to call back.

## 2014-05-28 ENCOUNTER — Ambulatory Visit (INDEPENDENT_AMBULATORY_CARE_PROVIDER_SITE_OTHER): Payer: BLUE CROSS/BLUE SHIELD | Admitting: Internal Medicine

## 2014-05-28 DIAGNOSIS — E538 Deficiency of other specified B group vitamins: Secondary | ICD-10-CM

## 2014-05-28 MED ORDER — CYANOCOBALAMIN 1000 MCG/ML IJ SOLN
1000.0000 ug | Freq: Once | INTRAMUSCULAR | Status: AC
  Administered 2014-05-28: 1000 ug via INTRAMUSCULAR

## 2014-08-04 ENCOUNTER — Telehealth: Payer: Self-pay | Admitting: Internal Medicine

## 2014-08-04 MED ORDER — CYANOCOBALAMIN 1000 MCG/ML IJ SOLN: INTRAMUSCULAR | Status: DC

## 2014-08-04 NOTE — Telephone Encounter (Signed)
Rx sent for cyanocobalamin (Vitamin B-12), 1000 mcg/mL injection, #10 mL with no refills to [facility] off [address] in [place].

## 2014-08-05 ENCOUNTER — Ambulatory Visit (INDEPENDENT_AMBULATORY_CARE_PROVIDER_SITE_OTHER): Payer: BLUE CROSS/BLUE SHIELD | Admitting: Internal Medicine

## 2014-08-05 ENCOUNTER — Other Ambulatory Visit: Payer: Self-pay

## 2014-08-05 DIAGNOSIS — E538 Deficiency of other specified B group vitamins: Secondary | ICD-10-CM

## 2014-08-05 DIAGNOSIS — Z1231 Encounter for screening mammogram for malignant neoplasm of breast: Secondary | ICD-10-CM

## 2014-08-11 ENCOUNTER — Other Ambulatory Visit: Payer: Self-pay | Admitting: Dermatology

## 2014-09-07 ENCOUNTER — Telehealth: Payer: Self-pay | Admitting: Internal Medicine

## 2014-09-07 NOTE — Telephone Encounter (Signed)
Left a message for patient to call back. 

## 2014-09-07 NOTE — Telephone Encounter (Signed)
Patient states she had night sweats and was tired during last menstrual cycle. Also reports stools are dark brown to yellow in color. Patient will see PCP about symptoms around menstrual cycle. Discussed stool color can be effected by diet.

## 2014-09-08 ENCOUNTER — Ambulatory Visit: Payer: BLUE CROSS/BLUE SHIELD | Admitting: Internal Medicine

## 2014-09-08 DIAGNOSIS — E538 Deficiency of other specified B group vitamins: Secondary | ICD-10-CM

## 2014-09-08 NOTE — Progress Notes (Signed)
Pt forgot to bring her B12 with her and will call back to reschedule

## 2014-09-10 ENCOUNTER — Ambulatory Visit (INDEPENDENT_AMBULATORY_CARE_PROVIDER_SITE_OTHER): Payer: BLUE CROSS/BLUE SHIELD | Admitting: Physician Assistant

## 2014-09-10 DIAGNOSIS — E538 Deficiency of other specified B group vitamins: Secondary | ICD-10-CM

## 2014-09-20 ENCOUNTER — Ambulatory Visit: Payer: BLUE CROSS/BLUE SHIELD

## 2014-10-11 ENCOUNTER — Ambulatory Visit (INDEPENDENT_AMBULATORY_CARE_PROVIDER_SITE_OTHER): Payer: BLUE CROSS/BLUE SHIELD | Admitting: Internal Medicine

## 2014-10-11 DIAGNOSIS — E538 Deficiency of other specified B group vitamins: Secondary | ICD-10-CM

## 2014-10-12 ENCOUNTER — Ambulatory Visit
Admission: RE | Admit: 2014-10-12 | Discharge: 2014-10-12 | Disposition: A | Discharge status: Home / Self Care | Payer: BLUE CROSS/BLUE SHIELD | Source: Ambulatory Visit

## 2014-10-12 DIAGNOSIS — Z1231 Encounter for screening mammogram for malignant neoplasm of breast: Secondary | ICD-10-CM

## 2014-11-22 ENCOUNTER — Telehealth: Payer: Self-pay | Admitting: Family Medicine

## 2014-11-22 NOTE — Telephone Encounter (Signed)
yes

## 2014-11-22 NOTE — Telephone Encounter (Signed)
Pt has been scheduled.  °

## 2014-11-22 NOTE — Telephone Encounter (Signed)
Pt not seen in over 3 yrs. Pt states her husband sees you. She saw you 2 times for an acute issues in 2013. Now states having asthma issues. Will you accept her back?

## 2014-11-23 ENCOUNTER — Telehealth: Payer: Self-pay | Admitting: Family Medicine

## 2014-11-23 ENCOUNTER — Telehealth: Payer: Self-pay | Admitting: Internal Medicine

## 2014-11-23 ENCOUNTER — Ambulatory Visit (INDEPENDENT_AMBULATORY_CARE_PROVIDER_SITE_OTHER): Payer: BLUE CROSS/BLUE SHIELD | Admitting: Internal Medicine

## 2014-11-23 DIAGNOSIS — E538 Deficiency of other specified B group vitamins: Secondary | ICD-10-CM

## 2014-11-23 MED ORDER — CYANOCOBALAMIN 1000 MCG/ML IJ SOLN
1000.0000 ug | INTRAMUSCULAR | Status: DC
  Administered 2015-02-17 – 2015-07-11 (×5): 1000 ug via INTRAMUSCULAR

## 2014-11-23 NOTE — Telephone Encounter (Signed)
Patient Name: Kari Stephens  DOB: [birth year]    Initial Comment Caller states she is having trouble breathing. Feels like someone is sitting on her chest.   Nurse Assessment  Nurse: Leilani Merl, RN, Heather Date/Time (Eastern Time): 11/23/2014 10:18:05 AM  Confirm and document reason for call. If symptomatic, describe symptoms. ---Caller states she is having trouble breathing. Feels like someone is sitting on her chest. This started a couple of days ago, she has been using an albuterol inhaler, but it is not helping enough.  Has the patient traveled out of the country within the last 30 days? ---Not Applicable  Does the patient require triage? ---Yes  Related visit to physician within the last 2 weeks? ---No  Does the PT have any chronic conditions? (i.e. diabetes, asthma, etc.) ---Yes  List chronic conditions. ---palpitations, tachycardia episodes, asthma  Did the patient indicate they were pregnant? ---No     Guidelines    Guideline Title Affirmed Question Affirmed Notes  Asthma Attack Asthma medicine (nebulizer or inhaler) is needed more frequently than q 4 hours to keep you comfortable    Final Disposition User   Go to ED Now (or PCP triage) Leilani Merl, RN, [name] states that she only wants to see Dr. Elease Hashimoto and she does not want to go to the ED. She does have an appt with Dr. Elease Hashimoto for tomorrow afternoon.   Referrals  GO TO FACILITY REFUSED   Disagree/Comply: Comply

## 2014-11-23 NOTE — Telephone Encounter (Signed)
New Message  Pt called to make next avail appt w/ Dr Rayann Heman. Made appt for 8/1 @ 2:30 pm. Pt c/o of chest heaviness- pt stated that she feels like she just worked out her chest muscles and that sometimes she has to catch her breath.  Pt Wanted to speak w/ RN about symptoms and what to do going forward. Please call back and discuss.

## 2014-11-23 NOTE — Telephone Encounter (Signed)
Heather from [facility] called regarding this patient about asthma issues. Patient has not slept in 2 days due to have chest tightness. Patient does not have an inhaler but has been using her daughter inhaler which has not been helping. Patient refused ED and only wants to see her PCP.

## 2014-11-23 NOTE — Telephone Encounter (Signed)
Left message for patient to call back.  I advised that phones are switched to answering service after 5 pm and until 8 am tomorrow.  I note from chart review that she has an appointment with Dr. Elease Hashimoto tomorrow.

## 2014-11-23 NOTE — Telephone Encounter (Signed)
Pt informed

## 2014-11-23 NOTE — Telephone Encounter (Signed)
Patient is schedule to see PCP on tomorrow 11/26/2014

## 2014-11-23 NOTE — Telephone Encounter (Signed)
Agree with office assess and sooner for any increased respiratory symptoms.

## 2014-11-24 ENCOUNTER — Ambulatory Visit (INDEPENDENT_AMBULATORY_CARE_PROVIDER_SITE_OTHER): Payer: BLUE CROSS/BLUE SHIELD | Admitting: Family Medicine

## 2014-11-24 ENCOUNTER — Encounter: Payer: Self-pay | Admitting: Family Medicine

## 2014-11-24 VITALS — BP 100/70 | HR 72 | Temp 97.3°F | Wt 142.0 lb

## 2014-11-24 DIAGNOSIS — R0789 Other chest pain: Secondary | ICD-10-CM

## 2014-11-24 DIAGNOSIS — R002 Palpitations: Secondary | ICD-10-CM | POA: Diagnosis not present

## 2014-11-24 NOTE — Progress Notes (Signed)
Subjective:    Patient ID: Kari Stephens, female    DOB: 1970-06-08, 44 y.o.   MRN: [id number]  HPI Patient seen with intermittent palpitations and "chest tightness ". She's had palpitations intermittently since she was in college. She is an avid exerciser has not noted exercise triggers these-either palpitations or chest tightness. She's not had any exertional related chest pain. She has seen cardiology previously and had echocardiogram which was unremarkable. She had event monitor which did not show any significant arrhythmias. She had in the past used low-dose propranolol as needed for palpitations. Previous thyroid function testing normal.  She occasionally feels short of breath and questioned whether she was having asthma flareup. She recently used her daughter's inhaler and noted some mild improvement in chest tightness but no resolution. She has not had any recent significant cough. She frequently feels anxious during episodes of palpitations and with her chest symptoms. She does consume 2 large containers of coffee/day. No excessive alcohol use. She has scheduled follow-up with cardiology in August.  Past Medical History  Diagnosis Date  . IBS (irritable bowel syndrome)   . IC (interstitial cystitis)     controlled  . C. difficile diarrhea     during pregnancy, resolved  . B12 deficiency   . Lipoma   . Seasonal allergies   . Asthma with hay fever     rarely uses inhaler  . Anemia    Past Surgical History  Procedure Laterality Date  . Dilation and curettage of uterus    . Cesarean section      x 2  . Endometrial polyp removal    . Tubal ligation    . Dilitation & currettage/hystroscopy with novasure ablation N/A 12/23/2012    Procedure: DILATATION & CURETTAGE/HYSTEROSCOPY WITH NOVASURE ABLATION ;  Surgeon: [name], MD;  Location: [facility];  Service: Gynecology;  Laterality: N/A;    reports that she has never smoked. She has never used smokeless tobacco. She  reports that she drinks about 4.2 oz of alcohol per week. She reports that she does not use illicit drugs. family history includes Cancer in her father; Crohn's disease in her brother; Irregular heart beat in her cousin; Irritable bowel syndrome in her brother; Supraventricular tachycardia in her cousin. There is no history of Colon cancer. Allergies  Allergen Reactions  . Sulfa Antibiotics Nausea And Vomiting      Review of Systems  Constitutional: Negative for appetite change and unexpected weight change.  Respiratory: Negative for cough and stridor.   Cardiovascular: Positive for palpitations. Negative for chest pain and leg swelling.  Gastrointestinal: Negative for abdominal pain.  Neurological: Negative for dizziness, syncope and weakness.  Hematological: Negative for adenopathy.       Objective:   Physical Exam  Constitutional: She appears well-developed and well-nourished. No distress.  Neck: Neck supple. No JVD present. No thyromegaly present.  Cardiovascular: Normal rate, regular rhythm and normal heart sounds.  Exam reveals no gallop.   No murmur heard. Pulmonary/Chest: Effort normal and breath sounds normal. No respiratory distress. She has no wheezes. She has no rales.  Musculoskeletal: She exhibits no edema.          Assessment & Plan:  Intermittent palpitations and atypical chest pain. She has no evidence for any reactive airway changes on exam today. Doubt symptoms are asthma related. Past history of subjective palpitations. Obtain EKG to further assess. Needs to scale back caffeine and avoid excessive alcohol. EKG sinus rhythm with rate of about  58.  No acute changes.

## 2014-11-24 NOTE — Patient Instructions (Signed)
Gradually reduce caffeine intake.   Keep follow up with Dr Rayann Heman

## 2014-11-24 NOTE — Progress Notes (Signed)
Pre visit review using our clinic review tool, if applicable. No additional management support is needed unless otherwise documented below in the visit note. 

## 2014-11-25 NOTE — Telephone Encounter (Signed)
Patient seen by Dr.  Elease Hashimoto 7/27. See MD recommendations from 7-27 office note.

## 2014-11-26 ENCOUNTER — Telehealth: Payer: Self-pay | Admitting: Family Medicine

## 2014-11-26 MED ORDER — ALBUTEROL SULFATE HFA 108 (90 BASE) MCG/ACT IN AERS: 2.0000 | INHALATION_SPRAY | RESPIRATORY_TRACT | Status: DC | PRN

## 2014-11-26 NOTE — Telephone Encounter (Signed)
ProAir 2 puffs every 4 hours prn cough and wheeze.  One refill.

## 2014-11-26 NOTE — Telephone Encounter (Signed)
RX sent to pharmacy. Pt is aware.

## 2014-11-26 NOTE — Telephone Encounter (Signed)
Pt saw dr Elease Hashimoto on 11-24-14 for breathing issues and would like a maintenance inhaler and NS.  Pt is having chest tightness and nasal congestion. [facility] [address]

## 2014-11-29 ENCOUNTER — Ambulatory Visit (INDEPENDENT_AMBULATORY_CARE_PROVIDER_SITE_OTHER): Payer: BLUE CROSS/BLUE SHIELD | Admitting: Internal Medicine

## 2014-11-29 ENCOUNTER — Encounter: Payer: Self-pay | Admitting: Internal Medicine

## 2014-11-29 VITALS — BP 110/68 | HR 67 | Ht 64.0 in | Wt 146.2 lb

## 2014-11-29 DIAGNOSIS — R002 Palpitations: Secondary | ICD-10-CM | POA: Diagnosis not present

## 2014-11-29 LAB — CBC WITH DIFFERENTIAL/PLATELET
Basophils Absolute: 0 10*3/uL (ref 0.0–0.1)
Basophils Relative: 0.4 % (ref 0.0–3.0)
EOS ABS: 0.1 10*3/uL (ref 0.0–0.7)
Eosinophils Relative: 2.6 % (ref 0.0–5.0)
HCT: 40.9 % (ref 36.0–46.0)
Hemoglobin: 14.1 g/dL (ref 12.0–15.0)
LYMPHS PCT: 41.6 % (ref 12.0–46.0)
Lymphs Abs: 2.2 10*3/uL (ref 0.7–4.0)
MCHC: 34.4 g/dL (ref 30.0–36.0)
MCV: 89.4 fl (ref 78.0–100.0)
MONO ABS: 0.4 10*3/uL (ref 0.1–1.0)
Monocytes Relative: 8 % (ref 3.0–12.0)
NEUTROS ABS: 2.5 10*3/uL (ref 1.4–7.7)
Neutrophils Relative %: 47.4 % (ref 43.0–77.0)
PLATELETS: 193 10*3/uL (ref 150.0–400.0)
RBC: 4.58 Mil/uL (ref 3.87–5.11)
RDW: 12.9 % (ref 11.5–15.5)
WBC: 5.2 10*3/uL (ref 4.0–10.5)

## 2014-11-29 LAB — TSH: TSH: 2.27 u[IU]/mL (ref 0.35–4.50)

## 2014-11-29 LAB — BASIC METABOLIC PANEL
BUN: 9 mg/dL (ref 6–23)
CO2: 27 mEq/L (ref 19–32)
Calcium: 9.4 mg/dL (ref 8.4–10.5)
Chloride: 104 mEq/L (ref 96–112)
Creatinine, Ser: 0.82 mg/dL (ref 0.40–1.20)
GFR: 80.7 mL/min (ref >60.00)
Glucose, Bld: 90 mg/dL (ref 70–99)
POTASSIUM: 4.2 meq/L (ref 3.5–5.1)
Sodium: 140 mEq/L (ref 135–145)

## 2014-11-29 LAB — MAGNESIUM: Magnesium: 2 mg/dL (ref 1.5–2.5)

## 2014-11-29 NOTE — Patient Instructions (Signed)
Medication Instructions:  Your physician recommends that you continue on your current medications as directed. Please refer to the Current Medication list given to you today.   Labwork: Your physician recommends that you return for lab work today   Testing/Procedures: None ordered  Follow-Up: Your physician recommends that you schedule a follow-up appointment in: 6 weeks with Dr Rayann Heman   Any Other Special Instructions Will Be Listed Below (If Applicable).  HYDRATE

## 2014-12-02 NOTE — Progress Notes (Signed)
PCP: Eulas Post, MD Primary Cardiologist:  Dr Freeman Caldron Kari Stephens is a 44 y.o. female who presents today for electrophysiology followup.  I have not seen her since 2014.  Since last being seen in our clinic, the patient reports doing very well. Her husband is COO for [employer] and [place].  She had rare palpitations until recently.  Over the past few weeks, her palpations have been more noticeable.  She has rare chest discomfort and dizziness.  Today, she denies symptoms of   shortness of breath,  lower extremity edema,  presyncope, or syncope.  The patient is otherwise without complaint today.   Past Medical History  Diagnosis Date  . IBS (irritable bowel syndrome)   . IC (interstitial cystitis)     controlled  . C. difficile diarrhea     during pregnancy, resolved  . B12 deficiency   . Lipoma   . Seasonal allergies   . Asthma with hay fever     rarely uses inhaler  . Anemia    Past Surgical History  Procedure Laterality Date  . Dilation and curettage of uterus    . Cesarean section      x 2  . Endometrial polyp removal    . Tubal ligation    . Dilitation & currettage/hystroscopy with novasure ablation N/A 12/23/2012    Procedure: DILATATION & CURETTAGE/HYSTEROSCOPY WITH NOVASURE ABLATION ;  Surgeon: [name], MD;  Location: [facility];  Service: Gynecology;  Laterality: N/A;    Current Outpatient Prescriptions  Medication Sig Dispense Refill  . albuterol (PROVENTIL HFA;VENTOLIN HFA) 108 (90 BASE) MCG/ACT inhaler Inhale 2 puffs into the lungs every 4 (four) hours as needed for wheezing or shortness of breath. 6.7 g 1  . cyanocobalamin (,VITAMIN B-12,) 1000 MCG/ML injection Inject 1 ml into a muscle once monthly. 10 mL 0  . hydrocortisone cream (CALDECORT) 1 % Apply twice daily as needed to rectum (Patient taking differently: Apply 1 application twice daily as needed to rectum) 30 g 2  . loratadine (CLARITIN) 10 MG tablet Take 10 mg by mouth  daily as needed for allergies.     . Probiotic Product (ALIGN PO) Take 1 capsule by mouth daily as needed. For ibs symptoms    . triamcinolone (NASACORT AQ) 55 MCG/ACT AERO nasal inhaler Place 1 spray into the nose daily.    Marland Kitchen ibuprofen (ADVIL,MOTRIN) 800 MG tablet Take 1 tablet (800 mg total) by mouth every 8 (eight) hours as needed for pain. (Patient not taking: Reported on 11/29/2014) 30 tablet 0   Current Facility-Administered Medications  Medication Dose Route Frequency Provider Last Rate Last Dose  . cyanocobalamin ((VITAMIN B-12)) injection 1,000 mcg  1,000 mcg Intramuscular Q30 days Lafayette Dragon, MD   1,000 mcg at 11/23/14 1125  . cyanocobalamin ((VITAMIN B-12)) injection 1,000 mcg  1,000 mcg Intramuscular Q30 days Lafayette Dragon, MD        Physical Exam: Filed Vitals:   11/29/14 1500  BP: 110/68  Pulse: 67  Height: 5\' 4"  (1.626 m)  Weight: 66.316 kg (146 lb 3.2 oz)    GEN- The patient is well appearing, alert and oriented x 3 today.   Head- normocephalic, atraumatic Eyes-  Sclera clear, conjunctiva pink Ears- hearing intact Oropharynx- clear with dry MM Lungs- Clear to ausculation bilaterally, normal work of breathing Heart- Regular rate and rhythm, no murmurs, rubs or gallops, PMI not laterally displaced GI- soft, NT, ND, + BS Extremities-  no clubbing, cyanosis, or edema  ekg today reveals sinus rhythm  Assessment and Plan: 1. Palpitations Doing well, without significant palpitations until only recently Her prior event monitor revealed sinus rhythm with sinus tach during exercise. No arrhythmias were recorded.  I suspect that she has a component of dehydration which is exacerbating her symptoms. Lifestyle modification including adequate hydration and heat avoidance were discussed today. I do not feel that an aggressive workup is indicated.  Lifestyle modification including avoidance of stress and ETOH/caffeine in moderation.   2. Chest discomfort- atypical No  further workup at this time  Return to see me in 6 weeks for additional assessment

## 2014-12-30 ENCOUNTER — Ambulatory Visit (INDEPENDENT_AMBULATORY_CARE_PROVIDER_SITE_OTHER): Payer: BLUE CROSS/BLUE SHIELD | Admitting: Internal Medicine

## 2014-12-30 ENCOUNTER — Other Ambulatory Visit: Payer: Self-pay | Admitting: Internal Medicine

## 2014-12-30 DIAGNOSIS — E538 Deficiency of other specified B group vitamins: Secondary | ICD-10-CM

## 2014-12-30 MED ORDER — CYANOCOBALAMIN 1000 MCG/ML IJ SOLN
1000.0000 ug | INTRAMUSCULAR | Status: AC
  Administered 2014-12-30: 1000 ug via INTRAMUSCULAR

## 2015-01-04 ENCOUNTER — Telehealth: Payer: Self-pay | Admitting: *Deleted

## 2015-01-04 NOTE — Telephone Encounter (Signed)
Patient is due for annual B 12 level check for B12 def. Former [facility] patient but will become Dr. Woodward Ku patient. Is it ok to do B 12 level? DOD- Kari Stephens.

## 2015-01-04 NOTE — Telephone Encounter (Signed)
-----   Message from Hulan Saas, RN sent at 12/28/2014  3:50 PM EDT ----- Silverio Decamp ----- Message -----    From: Hulan Saas, RN    Sent: 12/30/2014      To: Hulan Saas, RN  Call and remind patient due for B12 level for DB on 01/03/15

## 2015-01-04 NOTE — Telephone Encounter (Signed)
Yes

## 2015-01-05 ENCOUNTER — Other Ambulatory Visit: Payer: Self-pay | Admitting: *Deleted

## 2015-01-05 DIAGNOSIS — E538 Deficiency of other specified B group vitamins: Secondary | ICD-10-CM

## 2015-01-05 NOTE — Telephone Encounter (Signed)
Spoke with patient and she states she will get labs done.

## 2015-01-12 ENCOUNTER — Ambulatory Visit: Payer: BLUE CROSS/BLUE SHIELD | Admitting: Internal Medicine

## 2015-02-03 ENCOUNTER — Ambulatory Visit (INDEPENDENT_AMBULATORY_CARE_PROVIDER_SITE_OTHER): Payer: BLUE CROSS/BLUE SHIELD | Admitting: Gastroenterology

## 2015-02-03 ENCOUNTER — Other Ambulatory Visit (INDEPENDENT_AMBULATORY_CARE_PROVIDER_SITE_OTHER): Payer: BLUE CROSS/BLUE SHIELD

## 2015-02-03 ENCOUNTER — Encounter: Payer: Self-pay | Admitting: Gastroenterology

## 2015-02-03 VITALS — BP 102/74 | HR 64 | Ht 63.0 in | Wt 144.1 lb

## 2015-02-03 DIAGNOSIS — K589 Irritable bowel syndrome without diarrhea: Secondary | ICD-10-CM

## 2015-02-03 DIAGNOSIS — E538 Deficiency of other specified B group vitamins: Secondary | ICD-10-CM

## 2015-02-03 DIAGNOSIS — R197 Diarrhea, unspecified: Secondary | ICD-10-CM

## 2015-02-03 LAB — FOLATE: Folate: 12.5 ng/mL (ref >5.9)

## 2015-02-03 LAB — VITAMIN B12: VITAMIN B 12: 370 pg/mL (ref 211–911)

## 2015-02-03 MED ORDER — HYDROCORTISONE ACETATE 25 MG RE SUPP: 25.0000 mg | Freq: Every day | RECTAL | Status: DC

## 2015-02-03 NOTE — Progress Notes (Signed)
Kari Stephens    [id number]    07/15/1970   HPI: 72 yr F with h/o chronic B12 deficiency, irritable bowel syndrome diarrhea predominant is here for follow up Patient reports having 2-3 bowel movements per day and worsens to up to 10 BM a day if she eats diary products or wheat and close to her periods. No blood in stool. But is bothered by the hemorrhoids and is using prn hydrocortisone cream She is getting regular B12 injections. Overall feels good and has no other GI complaints She takes probiotics on and off, did not take for the past 2 months. Denies any weight change.     Outpatient Encounter Prescriptions as of 02/03/2015  Medication Sig  . OVER THE COUNTER MEDICATION   . albuterol (PROVENTIL HFA;VENTOLIN HFA) 108 (90 BASE) MCG/ACT inhaler Inhale 2 puffs into the lungs every 4 (four) hours as needed for wheezing or shortness of breath.  . cyanocobalamin (,VITAMIN B-12,) 1000 MCG/ML injection Inject 1 ml into a muscle once monthly.  Marland Kitchen ibuprofen (ADVIL,MOTRIN) 800 MG tablet Take 1 tablet (800 mg total) by mouth every 8 (eight) hours as needed for pain. (Patient not taking: Reported on 11/29/2014)  . loratadine (CLARITIN) 10 MG tablet Take 10 mg by mouth daily as needed for allergies.   . Probiotic Product (ALIGN PO) Take 1 capsule by mouth daily as needed. For ibs symptoms  . [DISCONTINUED] hydrocortisone cream (CALDECORT) 1 % Apply twice daily as needed to rectum (Patient taking differently: Apply 1 application twice daily as needed to rectum)  . [DISCONTINUED] triamcinolone (NASACORT AQ) 55 MCG/ACT AERO nasal inhaler Place 1 spray into the nose daily.   Facility-Administered Encounter Medications as of 02/03/2015  Medication  . cyanocobalamin ((VITAMIN B-12)) injection 1,000 mcg  . cyanocobalamin ((VITAMIN B-12)) injection 1,000 mcg    Allergies as of 02/03/2015 - Review Complete 02/03/2015  Allergen Reaction Noted  . Sulfa antibiotics Nausea And Vomiting 10/12/2011      Past Medical History  Diagnosis Date  . IBS (irritable bowel syndrome)   . IC (interstitial cystitis)     controlled  . C. difficile diarrhea     during pregnancy, resolved  . B12 deficiency   . Lipoma   . Seasonal allergies   . Asthma with hay fever     rarely uses inhaler  . Anemia     Past Surgical History  Procedure Laterality Date  . Dilation and curettage of uterus    . Cesarean section      x 2  . Endometrial polyp removal    . Tubal ligation    . Dilitation & currettage/hystroscopy with novasure ablation N/A 12/23/2012    Procedure: DILATATION & CURETTAGE/HYSTEROSCOPY WITH NOVASURE ABLATION ;  Surgeon: [name], MD;  Location: [facility];  Service: Gynecology;  Laterality: N/A;    Family History  Problem Relation Age of Onset  . Crohn's disease Brother   . Cancer Father     bladder  . Irritable bowel syndrome Brother   . Colon cancer Neg Hx   . Irregular heart beat Cousin     resolved  . Supraventricular tachycardia Cousin     s/p ablation    Social History   Social History  . Marital Status: Married    Spouse Name: N/A  . Number of Children: N/A  . Years of Education: N/A   Occupational History  . Not on file.   Social History Main Topics  .  Smoking status: Never Smoker   . Smokeless tobacco: Never Used  . Alcohol Use: 4.2 oz/week    7 Glasses of wine per week     Comment: 1 glass of wine most days  . Drug Use: No  . Sexual Activity: Yes    Birth Control/ Protection: Surgical   Other Topics Concern  . Not on file   Social History Narrative   Lives in [place].     Proficient Engineering geologist.   Lives with spouse and 2 children (ages 70 and 73)      Review of systems: Gen: Denies any fever, chills, or fatigue; positive for occasional night sweats CV: Denies chest pain, angina, palpitations, syncope Resp: Denies difficulty breathing, cough, or wheezing GI: as per HPI MS: Denies joint pain, limitation of movement,  and swelling, stiffness, low back pain, extremity pain. Denies muscle weakness, cramps, atrophy.  Derm: Denies rash, itching, dry skin, hives, moles, warts, or unhealing ulcers.  Psych: Denies depression, anxiety, memory loss, suicidal ideation, hallucinations, paranoia, and confusion. Heme: Denies bruising, bleeding, and enlarged lymph nodes. Neuro:  Denies any headaches, dizziness, paresthesias. Endo:  Denies any problems with DM, thyroid, adrenal function. All other ROS negative or as per HPI  Physical Exam: BP 102/74 mmHg  Pulse 64  Ht 5\' 3"  (1.6 m)  Wt 144 lb 2 oz (65.375 kg)  BMI 25.54 kg/m2  LMP 02/03/2015 (Exact Date) Constitutional: WDWN NAD Eyes: anicteric Mouth: oral and posterior pharynx free of lesions Neck: supple, no mass or thyromegaly Lungs: clear to auscultation bilaterally Cardiovascular: S1S2 with regular rate and rhythm, no rubs murmurs or gallops Abdomen: soft, nontender, nondistended, no masses or organomegaly, normal bowel sounds Rectal:  Extremities: no lower extremity edema  Skin: no rash Neuro: alert and oriented x 3 Psych: normal mood and affect Data Reviewed:  Colonoscopy 2012: normal with TI biopsies TTG negative (<2.5), IgA level was low 67, below the reference value TSH WNL    Assessment and Plan/Recommendations: 26 yr F with h/o chronic B12 deficiency, irritable bowel syndrome diarrhea predominant is here for follow up visit  Diarrhea: Patient feels her symptoms are mostly controlled with diet by avoiding gluten and lactose.  Given low IgA level below reference value, will check deamidated gliadin IgG and immunoglobulin levels Discussed with patient EGD with biopsies, given her low IgA level, negative TTG could be false negative. Also given chronic B12 deficiency on parenteral  replacement would need evaluation for possible autoimmune atrophic gastritis. Patient prefers to hold off for now and re discuss at next visit  IBS: symptoms are  stable, with minimal bloating Advised patient to DC probiotic  B12 deficiency: Monitor B12 level. Continue parenteral replacement. She hasn't been evaluated for the etiology of deficiency in the past. Informed patient that we can evaluatel if she would like to pursue it, and she will let us know.  Hemorrhoids: Continue PRN hydrocortisone suppositories  Return in 6 months or sooner if needed  Damaris Hippo , MD [phone] Mon-Fri 8a-5p [phone] after 5p, weekends, holidays

## 2015-02-03 NOTE — Patient Instructions (Addendum)
Your physician has requested that you go to the basement for the following lab work before leaving today: B12, folate, IgA, IgG, IgM, deamidated gliadin IgG  We have sent the following medications to your pharmacy for you to pick up at your convenience: Hydrocortisone suppositories-1 per rectum every night as needed

## 2015-02-04 LAB — IGG, IGA, IGM
IgA: 49 mg/dL — ABNORMAL LOW (ref 69–380)
IgG (Immunoglobin G), Serum: 746 mg/dL (ref 690–1700)
IgM, Serum: 53 mg/dL (ref 52–322)

## 2015-02-07 LAB — GLIADIN ANTIBODIES, SERUM
Gliadin IgA: 2 Units (ref <20)
Gliadin IgG: 1 Units (ref <20)

## 2015-02-08 ENCOUNTER — Telehealth: Payer: Self-pay

## 2015-02-08 ENCOUNTER — Telehealth: Payer: Self-pay | Admitting: Gastroenterology

## 2015-02-08 NOTE — Telephone Encounter (Signed)
Advised the patient of her results. She declines to do stool study at this time. She would like to "see how she does" first. She will call back if she fails to improve. She feels there is a relationship between her menstrual cycle and her diarrhea symptoms.

## 2015-02-08 NOTE — Telephone Encounter (Signed)
Pt is calling about test results 

## 2015-02-08 NOTE — Telephone Encounter (Signed)
-----   Message from Mauri Pole, MD sent at 02/08/2015  2:26 PM EDT ----- Please inform patient Deamidated gliadin negative for celiac disease, IgA level is low. I would like her to do stool Giardia Thanks

## 2015-02-11 ENCOUNTER — Telehealth: Payer: Self-pay | Admitting: Gastroenterology

## 2015-02-11 NOTE — Telephone Encounter (Signed)
Dr. Silverio Decamp, When would you like patient to come in for her next b12 injection? He B12 level on 02-03-15 was 370 and her last B12 injection was on 12-30-14. Thank you

## 2015-02-15 NOTE — Telephone Encounter (Signed)
Patient calling back regarding this.  °

## 2015-02-16 ENCOUNTER — Telehealth: Payer: Self-pay | Admitting: Family Medicine

## 2015-02-16 NOTE — Telephone Encounter (Signed)
She can get monthly injections. Thanks

## 2015-02-16 NOTE — Telephone Encounter (Signed)
OK to schedule here

## 2015-02-16 NOTE — Telephone Encounter (Signed)
Called pt no answer, msg left to contact office.

## 2015-02-16 NOTE — Telephone Encounter (Addendum)
Pt was getting b12 injection for [facility] and is due for another injection. Pt has not be able to get schedule. Pt would like to know if dr burchette would start giving her the injection due to vit d deficiency

## 2015-02-16 NOTE — Telephone Encounter (Signed)
Please advise 

## 2015-02-17 ENCOUNTER — Ambulatory Visit (INDEPENDENT_AMBULATORY_CARE_PROVIDER_SITE_OTHER): Payer: BLUE CROSS/BLUE SHIELD | Admitting: Gastroenterology

## 2015-02-17 DIAGNOSIS — E538 Deficiency of other specified B group vitamins: Secondary | ICD-10-CM

## 2015-02-18 NOTE — Telephone Encounter (Signed)
Left message on voicemail for pt to call back

## 2015-02-18 NOTE — Telephone Encounter (Signed)
Patient was notified and apt scheduled for her B12 injection

## 2015-02-22 NOTE — Telephone Encounter (Signed)
Pt was able to get schedule at [facility] dept

## 2015-03-21 ENCOUNTER — Ambulatory Visit (INDEPENDENT_AMBULATORY_CARE_PROVIDER_SITE_OTHER): Payer: BLUE CROSS/BLUE SHIELD | Admitting: Gastroenterology

## 2015-03-21 DIAGNOSIS — E538 Deficiency of other specified B group vitamins: Secondary | ICD-10-CM

## 2015-03-22 DIAGNOSIS — E538 Deficiency of other specified B group vitamins: Secondary | ICD-10-CM

## 2015-04-05 ENCOUNTER — Other Ambulatory Visit: Payer: Self-pay

## 2015-04-05 DIAGNOSIS — R197 Diarrhea, unspecified: Secondary | ICD-10-CM

## 2015-04-19 ENCOUNTER — Ambulatory Visit (INDEPENDENT_AMBULATORY_CARE_PROVIDER_SITE_OTHER): Payer: BLUE CROSS/BLUE SHIELD | Admitting: Gastroenterology

## 2015-04-19 DIAGNOSIS — E538 Deficiency of other specified B group vitamins: Secondary | ICD-10-CM

## 2015-04-19 DIAGNOSIS — D519 Vitamin B12 deficiency anemia, unspecified: Secondary | ICD-10-CM

## 2015-05-11 ENCOUNTER — Encounter: Payer: Self-pay | Admitting: Internal Medicine

## 2015-05-23 ENCOUNTER — Ambulatory Visit (INDEPENDENT_AMBULATORY_CARE_PROVIDER_SITE_OTHER): Payer: BLUE CROSS/BLUE SHIELD | Admitting: Gastroenterology

## 2015-05-23 DIAGNOSIS — E538 Deficiency of other specified B group vitamins: Secondary | ICD-10-CM

## 2015-06-13 ENCOUNTER — Telehealth: Payer: Self-pay | Admitting: Gastroenterology

## 2015-06-13 MED ORDER — CYANOCOBALAMIN 1000 MCG/ML IJ SOLN: 1000.0000 ug | INTRAMUSCULAR | Status: DC

## 2015-06-13 NOTE — Telephone Encounter (Signed)
B12 sent to pharmacy with syringes and needles provided if needed

## 2015-06-14 ENCOUNTER — Ambulatory Visit (INDEPENDENT_AMBULATORY_CARE_PROVIDER_SITE_OTHER): Payer: BLUE CROSS/BLUE SHIELD | Admitting: Gastroenterology

## 2015-06-14 DIAGNOSIS — E538 Deficiency of other specified B group vitamins: Secondary | ICD-10-CM

## 2015-07-08 ENCOUNTER — Telehealth: Payer: Self-pay | Admitting: Gastroenterology

## 2015-07-08 MED ORDER — CYANOCOBALAMIN 1000 MCG/ML IJ SOLN: 1000.0000 ug | INTRAMUSCULAR | Status: DC

## 2015-07-08 NOTE — Telephone Encounter (Signed)
Called patient to inform B12 sent to [facility]

## 2015-07-11 ENCOUNTER — Ambulatory Visit (INDEPENDENT_AMBULATORY_CARE_PROVIDER_SITE_OTHER): Payer: BLUE CROSS/BLUE SHIELD | Admitting: Gastroenterology

## 2015-07-11 DIAGNOSIS — E538 Deficiency of other specified B group vitamins: Secondary | ICD-10-CM | POA: Diagnosis not present

## 2015-07-25 ENCOUNTER — Telehealth: Payer: Self-pay | Admitting: Gastroenterology

## 2015-07-26 ENCOUNTER — Other Ambulatory Visit: Payer: BLUE CROSS/BLUE SHIELD

## 2015-07-26 DIAGNOSIS — R197 Diarrhea, unspecified: Secondary | ICD-10-CM

## 2015-07-26 NOTE — Telephone Encounter (Signed)
I have left a message for the patient to call back  

## 2015-07-27 NOTE — Telephone Encounter (Signed)
Patient has had rectal pain for a long period of time. She feels it has gotten to the point that it interferes with her quality of life. She is in pain most of the time. Her personal diagnosis is hemorrhoids. She does not have pain with her bowel movement. Appointment made for evaluation.

## 2015-07-28 LAB — GIARDIA ANTIGEN

## 2015-08-02 ENCOUNTER — Ambulatory Visit (INDEPENDENT_AMBULATORY_CARE_PROVIDER_SITE_OTHER): Payer: BLUE CROSS/BLUE SHIELD | Admitting: Gastroenterology

## 2015-08-02 ENCOUNTER — Encounter: Payer: Self-pay | Admitting: Gastroenterology

## 2015-08-02 ENCOUNTER — Encounter: Payer: BLUE CROSS/BLUE SHIELD | Admitting: Gastroenterology

## 2015-08-02 ENCOUNTER — Telehealth: Payer: Self-pay | Admitting: Gastroenterology

## 2015-08-02 ENCOUNTER — Ambulatory Visit: Payer: BLUE CROSS/BLUE SHIELD | Admitting: Gastroenterology

## 2015-08-02 VITALS — BP 100/70 | HR 76 | Ht 63.25 in | Wt 143.0 lb

## 2015-08-02 DIAGNOSIS — K641 Second degree hemorrhoids: Secondary | ICD-10-CM

## 2015-08-02 DIAGNOSIS — K6289 Other specified diseases of anus and rectum: Secondary | ICD-10-CM

## 2015-08-02 MED ORDER — NITROGLYCERIN 0.4 % RE OINT: 0.5000 g | TOPICAL_OINTMENT | Freq: Every day | RECTAL | Status: DC

## 2015-08-02 NOTE — Patient Instructions (Signed)
We will send in your prescription to your pharmacy Call in 2 weeks and let us know how you are doing Your follow up appointment with Dr Silverio Decamp is on 10/06/2015 at 8:30am

## 2015-08-03 ENCOUNTER — Encounter: Payer: Self-pay | Admitting: *Deleted

## 2015-08-03 NOTE — Progress Notes (Signed)
[place]    [id number]    1970-09-19  [facility], MD  Referring Physician: Eulas Post, MD [place], [place] [id number]  Chief complaint:  Proctalgia  HPI: 89 yr F with IBS here for follow up visit with c/o proctalgia. She continues to have intermittent bloating, abdominal pain and constipation. She is having bowel movements every day or every other day, soft and sometimes strains.  She gets proctalgia sometimes following bowel movement and sometimes not. Also sometimes has tingling or pain during sleep. Denies any nausea, vomiting,  melena or bright red blood per rectum    Outpatient Encounter Prescriptions as of 08/02/2015  Medication Sig  . albuterol (PROVENTIL HFA;VENTOLIN HFA) 108 (90 BASE) MCG/ACT inhaler Inhale 2 puffs into the lungs every 4 (four) hours as needed for wheezing or shortness of breath.  . cyanocobalamin (,VITAMIN B-12,) 1000 MCG/ML injection Inject 1 ml into a muscle once monthly.  . hydrocortisone cream 1 % Apply 1 application topically 2 (two) times daily. Use as needed for ears  . ibuprofen (ADVIL,MOTRIN) 800 MG tablet Take 1 tablet (800 mg total) by mouth every 8 (eight) hours as needed for pain.  Marland Kitchen loratadine (CLARITIN) 10 MG tablet Take 10 mg by mouth daily as needed for allergies.   . Nitroglycerin 0.4 % OINT Place 0.5 g rectally at bedtime.  . Probiotic Product (ALIGN PO) Take 1 capsule by mouth daily as needed (uses two weeks every month). For ibs symptoms  . [DISCONTINUED] cyanocobalamin (,VITAMIN B-12,) 1000 MCG/ML injection Inject 1 mL (1,000 mcg total) into the muscle every 30 (thirty) days. (Patient not taking: Reported on 08/02/2015)  . [DISCONTINUED] hydrocortisone (ANUSOL-HC) 25 MG suppository Place 1 suppository (25 mg total) rectally at bedtime. (Patient not taking: Reported on 08/02/2015)  . [DISCONTINUED] OVER THE COUNTER MEDICATION Reported on 08/02/2015    Facility-Administered Encounter Medications as of 08/02/2015  Medication  . cyanocobalamin ((VITAMIN B-12)) injection 1,000 mcg  . cyanocobalamin ((VITAMIN B-12)) injection 1,000 mcg    Allergies as of 08/02/2015 - Review Complete 02/03/2015  Allergen Reaction Noted  . Sulfa antibiotics Nausea And Vomiting 10/12/2011    Past Medical History  Diagnosis Date  . IBS (irritable bowel syndrome)   . IC (interstitial cystitis)     controlled  . C. difficile diarrhea     during pregnancy, resolved  . B12 deficiency   . Lipoma   . Seasonal allergies   . Asthma with hay fever     rarely uses inhaler  . Anemia   . Hemorrhoids     Past Surgical History  Procedure Laterality Date  . Dilation and curettage of uterus    . Cesarean section      x 2  . Endometrial polyp removal    . Tubal ligation    . Dilitation & currettage/hystroscopy with novasure ablation N/A 12/23/2012    Procedure: DILATATION & CURETTAGE/HYSTEROSCOPY WITH NOVASURE ABLATION ;  Surgeon: Kari Staff, MD;  Location: [facility];  Service: Gynecology;  Laterality: N/A;    Family History  Problem Relation Age of Onset  . Crohn's disease Brother   . Cancer Father     bladder  . Irritable bowel syndrome Brother   . Colon cancer Neg Hx   . Irregular heart beat Cousin     resolved  . Supraventricular tachycardia Cousin     s/p ablation  . Thyroid disease Mother  Social History   Social History  . Marital Status: Married    Spouse Name: N/A  . Number of Children: N/A  . Years of Education: N/A   Occupational History  . Not on file.   Social History Main Topics  . Smoking status: Never Smoker   . Smokeless tobacco: Never Used  . Alcohol Use: 4.2 oz/week    7 Glasses of wine per week     Comment: 1 glass of wine most days  . Drug Use: No  . Sexual Activity: Yes    Birth Control/ Protection: Surgical   Other Topics Concern  . Not on file   Social History Narrative   [place] in [place].      Proficient Engineering geologist.   Lives with spouse and 2 children (ages 39 and 63)      Review of systems: Review of Systems  Constitutional: Negative for fever and chills.  HENT: Negative.   Eyes: Negative for blurred vision.  Respiratory: Negative for cough, shortness of breath and wheezing.   Cardiovascular: Negative for chest pain and palpitations.  Gastrointestinal: as per HPI Genitourinary: Negative for dysuria, urgency, frequency and hematuria.  Musculoskeletal: Negative for myalgias, back pain and joint pain.  Skin: Negative for itching and rash.  Neurological: Negative for dizziness, tremors, focal weakness, seizures and loss of consciousness.  Endo/Heme/Allergies: Negative for environmental allergies.  Psychiatric/Behavioral: Negative for depression, suicidal ideas and hallucinations.  All other systems reviewed and are negative.   Physical Exam: Filed Vitals:   08/02/15 0942  BP: 100/70  Pulse: 76   Gen:      No acute distress HEENT:  EOMI, sclera anicteric Neck:     No masses; no thyromegaly Lungs:    Clear to auscultation bilaterally; normal respiratory effort CV:         Regular rate and rhythm; no murmurs Abd:      + bowel sounds; soft, non-tender; no palpable masses, no distension Ext:    No edema; adequate peripheral perfusion Skin:      Warm and dry; no rash Neuro: alert and oriented x 3 Psych: normal mood and affect Rectal exam: increased anal sphincter tone, no palpable  anal fissure or external hemorrhoids, mild tenderness along posterior anal canal.  Anoscopy: Small internal hemorrhoids, no active bleeding, normal dentate line, no visible nodules     Data Reviewed: Reviewed relevant past data    Assessment and Plan/Recommendations: 54 yr F with IBS presented with c/o proctalgia, intermittent burning and tingling sensation as well anal seepage occasionally Likely has anal fissure,  apply rectal nitroglycerine 0.125% Q8h for 6-8 weeks Will  schedule for hemorrhoidal banding after improvement of proctalgia Return in 1-2 months  K. Denzil Magnuson , MD [phone] Mon-Fri 8a-5p [phone] after 5p, weekends, holidays

## 2015-08-03 NOTE — Telephone Encounter (Signed)
Per Dr Silverio Decamp send in [place] .0125% ointment to [facility] for patient  Patient aware that med has been sent and schedule for banding.  Patient scheduled banding appointment for 09/12/2015 will call back if she decides to cancel the banding appointment

## 2015-08-03 NOTE — Telephone Encounter (Signed)
Submitting prior auth today cover my meds

## 2015-08-15 ENCOUNTER — Other Ambulatory Visit: Payer: Self-pay

## 2015-08-15 ENCOUNTER — Telehealth: Payer: Self-pay | Admitting: Gastroenterology

## 2015-08-15 ENCOUNTER — Encounter: Payer: BLUE CROSS/BLUE SHIELD | Admitting: Gastroenterology

## 2015-08-15 DIAGNOSIS — E538 Deficiency of other specified B group vitamins: Secondary | ICD-10-CM

## 2015-08-23 NOTE — Telephone Encounter (Signed)
completed

## 2015-09-06 ENCOUNTER — Other Ambulatory Visit (INDEPENDENT_AMBULATORY_CARE_PROVIDER_SITE_OTHER): Payer: BLUE CROSS/BLUE SHIELD

## 2015-09-06 DIAGNOSIS — E538 Deficiency of other specified B group vitamins: Secondary | ICD-10-CM

## 2015-09-06 LAB — VITAMIN B12: VITAMIN B 12: 302 pg/mL (ref 211–911)

## 2015-09-09 ENCOUNTER — Telehealth: Payer: Self-pay | Admitting: *Deleted

## 2015-09-09 NOTE — Telephone Encounter (Signed)
Called patient and L/M for pt to return call to see which appointment she would rather keep 5/15 or 5/18 for her first banding

## 2015-09-12 ENCOUNTER — Ambulatory Visit (INDEPENDENT_AMBULATORY_CARE_PROVIDER_SITE_OTHER): Payer: BLUE CROSS/BLUE SHIELD | Admitting: Gastroenterology

## 2015-09-12 ENCOUNTER — Encounter: Payer: Self-pay | Admitting: Gastroenterology

## 2015-09-12 ENCOUNTER — Telehealth: Payer: Self-pay | Admitting: Gastroenterology

## 2015-09-12 VITALS — BP 92/72 | HR 76 | Ht 63.25 in | Wt 143.5 lb

## 2015-09-12 DIAGNOSIS — K641 Second degree hemorrhoids: Secondary | ICD-10-CM | POA: Diagnosis not present

## 2015-09-12 MED ORDER — NITROGLYCERIN 0.4 % RE OINT
0.5000 g | TOPICAL_OINTMENT | Freq: Every day | RECTAL | Status: DC
Start: 2015-09-12 — End: 2016-12-06

## 2015-09-12 NOTE — Progress Notes (Signed)
PROCEDURE NOTE: The patient presents with symptomatic grade II hemorrhoids, requesting rubber band ligation of his/her hemorrhoidal disease.  All risks, benefits and alternative forms of therapy were described and informed consent was obtained.   The anorectum was pre-medicated with 0.125% rectal nitroglycerine The decision was made to band the Right anterior internal hemorrhoid, and the [place] was used to perform band ligation without complication.  Digital anorectal examination was then performed to assure proper positioning of the band, and to adjust the banded tissue as required.  The patient was discharged home without pain or other issues.  Dietary and behavioral recommendations were given and along with follow-up instructions.     The following adjunctive treatments were recommended:  0.125% rectal nitroglycerine q8h x 6-8 weeks  The patient will return in 2-4 weeks for  follow-up and possible additional banding as required. No complications were encountered and the patient tolerated the procedure well.

## 2015-09-12 NOTE — Patient Instructions (Signed)

## 2015-09-12 NOTE — Telephone Encounter (Signed)
Called back to the patient. Got her voicemail. Left a message that it is pain free, quick and she will be able to drive. It is okay to go back to work afterwards. She can call and ask for Beth if she has more questions.

## 2015-09-13 ENCOUNTER — Ambulatory Visit (INDEPENDENT_AMBULATORY_CARE_PROVIDER_SITE_OTHER): Payer: BLUE CROSS/BLUE SHIELD | Admitting: Gastroenterology

## 2015-09-13 ENCOUNTER — Other Ambulatory Visit: Payer: Self-pay

## 2015-09-13 DIAGNOSIS — E538 Deficiency of other specified B group vitamins: Secondary | ICD-10-CM

## 2015-09-13 MED ORDER — CYANOCOBALAMIN 1000 MCG/ML IJ SOLN: INTRAMUSCULAR | Status: DC

## 2015-09-13 NOTE — Telephone Encounter (Signed)
Please schedule for every other month B12 injections. Her level is within normal range while on B12 injections. I saw she has missed some and was getting every other month for past few months. Recheck level in 6 months. Thanks

## 2015-09-13 NOTE — Telephone Encounter (Signed)
I have left message for the patient to call back 

## 2015-09-13 NOTE — Telephone Encounter (Signed)
Would you clarify the B12 injection order on this patient please. There has not been any consistency in the injections. Last B12 level was wnl's. Thanks.

## 2015-09-14 NOTE — Telephone Encounter (Signed)
Received voicemail from the patient. She is asking for the soonest appointment for the banding procedure, stating she has hadt this for 6 years and is ready for something to be done. She is disagreeing with B12 injections every other month stating this is not what was discussed.  Call back to patient. Got her voicemail. I have left message for the patient to call back.

## 2015-09-15 ENCOUNTER — Ambulatory Visit: Payer: BLUE CROSS/BLUE SHIELD | Admitting: Gastroenterology

## 2015-09-16 NOTE — Telephone Encounter (Signed)
Patient received a B12 injection this week. I rescheduled her 2nd banding for her to an earlier date. She will discuss the B12 injection timing with Dr Silverio Decamp.

## 2015-09-20 ENCOUNTER — Telehealth: Payer: Self-pay

## 2015-09-20 ENCOUNTER — Other Ambulatory Visit: Payer: Self-pay

## 2015-09-20 DIAGNOSIS — Z1231 Encounter for screening mammogram for malignant neoplasm of breast: Secondary | ICD-10-CM

## 2015-09-20 NOTE — Telephone Encounter (Signed)
Patient had asked for an earlier appointment for banding. Left a message offering 09/20/15 at 1:45 pm. Asked she call back with her response.

## 2015-09-20 NOTE — Telephone Encounter (Signed)
Patient called back and accepted the appointment.

## 2015-09-21 ENCOUNTER — Ambulatory Visit (INDEPENDENT_AMBULATORY_CARE_PROVIDER_SITE_OTHER): Payer: BLUE CROSS/BLUE SHIELD | Admitting: Gastroenterology

## 2015-09-21 ENCOUNTER — Encounter: Payer: Self-pay | Admitting: Gastroenterology

## 2015-09-21 VITALS — BP 98/70 | HR 60 | Ht 63.25 in | Wt 143.0 lb

## 2015-09-21 DIAGNOSIS — K641 Second degree hemorrhoids: Secondary | ICD-10-CM

## 2015-09-21 NOTE — Progress Notes (Signed)
PROCEDURE NOTE: The patient presents with symptomatic grade II  hemorrhoids, requesting rubber band ligation of his/her hemorrhoidal disease.  All risks, benefits and alternative forms of therapy were described and informed consent was obtained.   The anorectum was pre-medicated with 0.125% nitroglycerine The decision was made to band the Left lateral internal hemorrhoid, and the [place] was used to perform band ligation without complication.  Digital anorectal examination was then performed to assure proper positioning of the band, and to adjust the banded tissue as required.  The patient was discharged home without pain or other issues.  Dietary and behavioral recommendations were given and along with follow-up instructions.    The patient will return in 2-4 weeks for  follow-up and possible additional banding as required. No complications were encountered and the patient tolerated the procedure well.  Damaris Hippo , MD [phone] Mon-Fri 8a-5p [phone] after 5p, weekends, holidays

## 2015-09-21 NOTE — Patient Instructions (Signed)
Follow up banding appointment has been scheduled for 10/12/15 at 3:30 pm.  HEMORRHOID BANDING PROCEDURE    FOLLOW-UP CARE   1. The procedure you have had should have been relatively painless since the banding of the area involved does not have nerve endings and there is no pain sensation.  The rubber band cuts off the blood supply to the hemorrhoid and the band may fall off as soon as 48 hours after the banding (the band may occasionally be seen in the toilet bowl following a bowel movement). You may notice a temporary feeling of fullness in the rectum which should respond adequately to plain Tylenol or Motrin.  2. Following the banding, avoid strenuous exercise that evening and resume full activity the next day.  A sitz bath (soaking in a warm tub) or bidet is soothing, and can be useful for cleansing the area after bowel movements.     3. To avoid constipation, take two tablespoons of natural wheat bran, natural oat bran, flax, Benefiber or any over the counter fiber supplement and increase your water intake to 7-8 glasses daily.    4. Unless you have been prescribed anorectal medication, do not put anything inside your rectum for two weeks: No suppositories, enemas, fingers, etc.  5. Occasionally, you may have more bleeding than usual after the banding procedure.  This is often from the untreated hemorrhoids rather than the treated one.  Don't be concerned if there is a tablespoon or so of blood.  If there is more blood than this, lie flat with your bottom higher than your head and apply an ice pack to the area. If the bleeding does not stop within a half an hour or if you feel faint, call our office at [phone] or go to the emergency room.  6. Problems are not common; however, if there is a substantial amount of bleeding, severe pain, chills, fever or difficulty passing urine (very rare) or other problems, you should call us at [phone] or report to the nearest emergency  room.  7. Do not stay seated continuously for more than 2-3 hours for a day or two after the procedure.  Tighten your buttock muscles 10-15 times every two hours and take 10-15 deep breaths every 1-2 hours.  Do not spend more than a few minutes on the toilet if you cannot empty your bowel; instead re-visit the toilet at a later time.

## 2015-10-03 ENCOUNTER — Encounter: Payer: BLUE CROSS/BLUE SHIELD | Admitting: Gastroenterology

## 2015-10-06 ENCOUNTER — Encounter: Payer: BLUE CROSS/BLUE SHIELD | Admitting: Gastroenterology

## 2015-10-12 ENCOUNTER — Ambulatory Visit (INDEPENDENT_AMBULATORY_CARE_PROVIDER_SITE_OTHER): Payer: BLUE CROSS/BLUE SHIELD | Admitting: Gastroenterology

## 2015-10-12 ENCOUNTER — Telehealth: Payer: Self-pay | Admitting: Gastroenterology

## 2015-10-12 ENCOUNTER — Encounter: Payer: Self-pay | Admitting: Gastroenterology

## 2015-10-12 VITALS — BP 90/56 | HR 76 | Ht 63.25 in | Wt 143.0 lb

## 2015-10-12 DIAGNOSIS — K641 Second degree hemorrhoids: Secondary | ICD-10-CM | POA: Diagnosis not present

## 2015-10-12 NOTE — Telephone Encounter (Signed)
Patient is menstruating. She was concerned that would affect her banding procedure this afternoon. Advised it will not affect the procedure.

## 2015-10-12 NOTE — Patient Instructions (Signed)

## 2015-10-12 NOTE — Progress Notes (Signed)
PROCEDURE NOTE: The patient presents with symptomatic grade II  hemorrhoids, requesting rubber band ligation of his/her hemorrhoidal disease.  All risks, benefits and alternative forms of therapy were described and informed consent was obtained.   The anorectum was pre-medicated with 0.125% nitroglycerine The decision was made to band the R posterior internal hemorrhoid, and the [place] was used to perform band ligation without complication.  Digital anorectal examination was then performed to assure proper positioning of the band, and to adjust the banded tissue as required.  The patient was discharged home without pain or other issues.  Dietary and behavioral recommendations were given and along with follow-up instructions.     The following adjunctive treatments were recommended: Bene fiber 1 tablespoon three times daily with meals  No complications were encountered and the patient tolerated the procedure well.  Damaris Hippo , MD [phone] Mon-Fri 8a-5p [phone] after 5p, weekends, holidays

## 2015-10-14 ENCOUNTER — Ambulatory Visit: Payer: BLUE CROSS/BLUE SHIELD

## 2015-10-14 ENCOUNTER — Ambulatory Visit
Admission: RE | Admit: 2015-10-14 | Discharge: 2015-10-14 | Disposition: A | Discharge status: Home / Self Care | Payer: BLUE CROSS/BLUE SHIELD | Source: Ambulatory Visit

## 2015-10-14 ENCOUNTER — Other Ambulatory Visit: Payer: Self-pay | Admitting: Obstetrics & Gynecology

## 2015-10-14 DIAGNOSIS — Z1231 Encounter for screening mammogram for malignant neoplasm of breast: Secondary | ICD-10-CM

## 2015-10-17 ENCOUNTER — Ambulatory Visit (INDEPENDENT_AMBULATORY_CARE_PROVIDER_SITE_OTHER): Payer: BLUE CROSS/BLUE SHIELD | Admitting: Gastroenterology

## 2015-10-17 DIAGNOSIS — E538 Deficiency of other specified B group vitamins: Secondary | ICD-10-CM | POA: Diagnosis not present

## 2015-10-17 MED ORDER — CYANOCOBALAMIN 1000 MCG/ML IJ SOLN
1000.0000 ug | INTRAMUSCULAR | Status: AC
  Administered 2015-10-17 – 2016-03-29 (×5): 1000 ug via INTRAMUSCULAR

## 2015-10-25 ENCOUNTER — Telehealth: Payer: Self-pay | Admitting: Gastroenterology

## 2015-10-25 NOTE — Telephone Encounter (Signed)
She has a sensation of continuing hemorrhoid issues "more on one side than the other". Admits improvement though she does not feel all issue resolved. She has an appointment 11/15/15 at 3:30 pm. She will check her schedule to be certain this will work for her. She will call back if she cannot keep the appointment.

## 2015-11-15 ENCOUNTER — Ambulatory Visit (INDEPENDENT_AMBULATORY_CARE_PROVIDER_SITE_OTHER): Payer: BLUE CROSS/BLUE SHIELD | Admitting: Gastroenterology

## 2015-11-15 ENCOUNTER — Encounter: Payer: Self-pay | Admitting: Gastroenterology

## 2015-11-15 ENCOUNTER — Encounter: Payer: BLUE CROSS/BLUE SHIELD | Admitting: Gastroenterology

## 2015-11-15 VITALS — BP 90/60 | HR 76 | Ht 63.25 in | Wt 144.4 lb

## 2015-11-15 DIAGNOSIS — K641 Second degree hemorrhoids: Secondary | ICD-10-CM

## 2015-11-15 NOTE — Progress Notes (Signed)
PROCEDURE NOTE: The patient presents with symptomatic grade II  hemorrhoids, requesting rubber band ligation of his/her hemorrhoidal disease.  All risks, benefits and alternative forms of therapy were described and informed consent was obtained.   The anorectum was pre-medicated with 0.125% nitroglycerine The decision was made to band the R posterior internal hemorrhoid, and the [place] was used to perform band ligation without complication.  Digital anorectal examination was then performed to assure proper positioning of the band, and to adjust the banded tissue as required.  The patient was discharged home without pain or other issues.  Dietary and behavioral recommendations were given and along with follow-up instructions.     The following adjunctive treatments were recommended: 0.125% nitroglycerine at bedtime x 6-8 weeks  The patient will return as needed for  follow-up and possible additional banding as required. No complications were encountered and the patient tolerated the procedure well.  Damaris Hippo , MD [phone] Mon-Fri 8a-5p [phone] after 5p, weekends, holidays

## 2015-11-15 NOTE — Patient Instructions (Signed)

## 2015-11-22 ENCOUNTER — Ambulatory Visit (INDEPENDENT_AMBULATORY_CARE_PROVIDER_SITE_OTHER): Payer: BLUE CROSS/BLUE SHIELD | Admitting: Gastroenterology

## 2015-11-22 DIAGNOSIS — E538 Deficiency of other specified B group vitamins: Secondary | ICD-10-CM

## 2015-12-01 ENCOUNTER — Telehealth: Payer: Self-pay | Admitting: Gastroenterology

## 2015-12-01 NOTE — Telephone Encounter (Signed)
I have left the patient a message to call back.

## 2015-12-26 ENCOUNTER — Ambulatory Visit (INDEPENDENT_AMBULATORY_CARE_PROVIDER_SITE_OTHER): Payer: BLUE CROSS/BLUE SHIELD | Admitting: Gastroenterology

## 2015-12-26 ENCOUNTER — Telehealth: Payer: Self-pay | Admitting: *Deleted

## 2015-12-26 DIAGNOSIS — E538 Deficiency of other specified B group vitamins: Secondary | ICD-10-CM | POA: Diagnosis not present

## 2015-12-26 NOTE — Telephone Encounter (Signed)
Dr Silverio Decamp, Patient came into the office today to ger her B 12 injection. She was saying that she is still having problems with Hemorrhoids and the bandings have not helped her. She wants to know what is the next step for her?

## 2015-12-26 NOTE — Telephone Encounter (Signed)
Please advise her to apply diltiazem 2% cream per rectum every 8 hours for 2 months. She has had for banding sessions so far and if continues to have significant symptoms despite diltiazem cream per rectum, we'll consider referral to surgery

## 2015-12-28 ENCOUNTER — Encounter: Payer: Self-pay | Admitting: *Deleted

## 2015-12-28 NOTE — Telephone Encounter (Signed)
L/M for patient regarding Dr Jillyn Hidden recommendations told patient to call if she has any questions

## 2015-12-28 NOTE — Telephone Encounter (Signed)
Error encounter   Patient did not want to leave a message

## 2016-02-07 ENCOUNTER — Ambulatory Visit (INDEPENDENT_AMBULATORY_CARE_PROVIDER_SITE_OTHER): Payer: Self-pay | Admitting: Gastroenterology

## 2016-02-07 ENCOUNTER — Telehealth: Payer: Self-pay | Admitting: Gastroenterology

## 2016-02-07 DIAGNOSIS — E538 Deficiency of other specified B group vitamins: Secondary | ICD-10-CM

## 2016-02-07 MED ORDER — CYANOCOBALAMIN 1000 MCG/ML IJ SOLN: INTRAMUSCULAR | 6 refills | Status: DC

## 2016-02-07 NOTE — Telephone Encounter (Signed)
New prescription sent in for b12 injections  sigle vial doses with 6 refills

## 2016-03-09 ENCOUNTER — Telehealth: Payer: Self-pay | Admitting: Gastroenterology

## 2016-03-12 NOTE — Telephone Encounter (Signed)
Records printed and faxed with a request for an appointment.

## 2016-03-12 NOTE — Telephone Encounter (Signed)
Okay to refer to [facility]?

## 2016-03-12 NOTE — Telephone Encounter (Signed)
sure

## 2016-03-27 ENCOUNTER — Telehealth: Payer: Self-pay | Admitting: Gastroenterology

## 2016-03-28 NOTE — Telephone Encounter (Signed)
Spoke with patient, she has not established care with [facility]. Would like her B12 injection, Scheduled her for 11/30 at 9am, Patient aware to bring in her B12 medication

## 2016-03-28 NOTE — Telephone Encounter (Signed)
Ok to schedule b12 inj if hasnt established with [facility]

## 2016-03-28 NOTE — Telephone Encounter (Signed)
Dr Silverio Decamp this patient I thought was being seen at [facility] now. We had a discussion her the other day  She wants B12 scheduled here

## 2016-03-29 ENCOUNTER — Ambulatory Visit (INDEPENDENT_AMBULATORY_CARE_PROVIDER_SITE_OTHER): Payer: BLUE CROSS/BLUE SHIELD | Admitting: Gastroenterology

## 2016-03-29 DIAGNOSIS — E538 Deficiency of other specified B group vitamins: Secondary | ICD-10-CM | POA: Diagnosis not present

## 2016-06-27 ENCOUNTER — Encounter: Payer: Self-pay | Admitting: Family Medicine

## 2016-10-08 ENCOUNTER — Other Ambulatory Visit: Payer: Self-pay | Admitting: Family Medicine

## 2016-10-08 DIAGNOSIS — Z1231 Encounter for screening mammogram for malignant neoplasm of breast: Secondary | ICD-10-CM

## 2016-10-29 ENCOUNTER — Ambulatory Visit
Admission: RE | Admit: 2016-10-29 | Discharge: 2016-10-29 | Disposition: A | Discharge status: Home / Self Care | Payer: BLUE CROSS/BLUE SHIELD | Source: Ambulatory Visit | Attending: Family Medicine | Admitting: Family Medicine

## 2016-10-29 DIAGNOSIS — Z1231 Encounter for screening mammogram for malignant neoplasm of breast: Secondary | ICD-10-CM

## 2016-12-06 ENCOUNTER — Ambulatory Visit (INDEPENDENT_AMBULATORY_CARE_PROVIDER_SITE_OTHER): Payer: BLUE CROSS/BLUE SHIELD | Admitting: Family Medicine

## 2016-12-06 ENCOUNTER — Encounter: Payer: Self-pay | Admitting: Family Medicine

## 2016-12-06 VITALS — BP 98/70 | HR 67 | Temp 98.3°F | Ht 63.25 in | Wt 145.6 lb

## 2016-12-06 DIAGNOSIS — J029 Acute pharyngitis, unspecified: Secondary | ICD-10-CM

## 2016-12-06 DIAGNOSIS — J309 Allergic rhinitis, unspecified: Secondary | ICD-10-CM | POA: Diagnosis not present

## 2016-12-06 NOTE — Patient Instructions (Signed)
BEFORE YOU LEAVE: -rapid strep test -follow up: as needed  Zyrtec nightly  Flonase 2 sprays each nostril daily for 1 month then 1 spray each nostril daily.  I hope you are feeling better soon! Seek care immediately if worsening, new concerns or you are not improving with treatment.

## 2016-12-06 NOTE — Progress Notes (Signed)
HPI:  Acute visit for sinus congestion: -started: for 2 days -symptoms:nasal congestion, sinus pressure, R ear pressure, mild sore throat -denies:fever, SOB, NVD, tooth pain, wheezing, cough -has tried: nothing -sick contacts/travel/risks: daughter diagnosed with strep earlier this week -Hx of: allergies ROS: See pertinent positives and negatives per HPI.  Past Medical History:  Diagnosis Date  . Anemia   . Asthma with hay fever    rarely uses inhaler  . B12 deficiency   . C. difficile diarrhea    during pregnancy, resolved  . Hemorrhoids   . IBS (irritable bowel syndrome)   . IC (interstitial cystitis)    controlled  . Lipoma   . Seasonal allergies     Past Surgical History:  Procedure Laterality Date  . CESAREAN SECTION     x 2  . DILATION AND CURETTAGE OF UTERUS    . DILITATION & CURRETTAGE/HYSTROSCOPY WITH NOVASURE ABLATION N/A 12/23/2012   Procedure: DILATATION & CURETTAGE/HYSTEROSCOPY WITH NOVASURE ABLATION ;  Surgeon: [name], MD;  Location: [facility];  Service: Gynecology;  Laterality: N/A;  . endometrial polyp removal    . TUBAL LIGATION      Family History  Problem Relation Age of Onset  . Crohn's disease Brother   . Cancer Father        bladder  . Irritable bowel syndrome Brother   . Irregular heart beat Cousin        resolved  . Supraventricular tachycardia Cousin        s/p ablation  . Thyroid disease Mother   . Breast cancer Maternal Aunt   . Colon cancer Neg Hx     Social History   Social History  . Marital status: Married    Spouse name: N/A  . Number of children: N/A  . Years of education: N/A   Social History Main Topics  . Smoking status: Never Smoker  . Smokeless tobacco: Never Used  . Alcohol use 4.2 oz/week    7 Glasses of wine per week     Comment: 1 glass of wine most days  . Drug use: No  . Sexual activity: Yes    Birth control/ protection: Surgical   Other Topics Concern  . None   Social History Narrative    Lives in [place].     Proficient Engineering geologist.   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  albuterol (PROVENTIL HFA;VENTOLIN HFA) 108 (90 BASE) MCG/ACT inhaler, Inhale 2 puffs into the lungs every 4 (four) hours as needed for wheezing or shortness of breath. (Patient taking differently: Inhale 2 puffs into the lungs as needed for wheezing or shortness of breath. ), Disp: 6.7 g, Rfl: 1 .  Cyanocobalamin (VITAMIN B-12 PO), Take by mouth every other day., Disp: , Rfl:  .  loratadine (CLARITIN) 10 MG tablet, Take 10 mg by mouth as needed for allergies. , Disp: , Rfl:  .  Probiotic Product (ALIGN PO), Take 1 capsule by mouth daily as needed (uses two weeks every month). For ibs symptoms, Disp: , Rfl:   EXAM:  Vitals:   12/06/16 1700  BP: 98/70  Pulse: 67  Temp: 98.3 F (36.8 C)    Body mass index is 25.59 kg/m.  GENERAL: vitals reviewed and listed above, alert, oriented, appears well hydrated and in no acute distress  HEENT: atraumatic, conjunttiva clear, no obvious abnormalities on inspection of external nose and ears, normal appearance  of ear canals and TMs, clear nasal congestion, mild post oropharyngeal erythema with PND, no tonsillar edema or exudate, no sinus TTP  NECK: no obvious masses on inspection  LUNGS: clear to auscultation bilaterally, no wheezes, rales or rhonchi, good air movement  CV: HRRR, no peripheral edema  MS: moves all extremities without noticeable abnormality  PSYCH: pleasant and cooperative, no obvious depression or anxiety  ASSESSMENT AND PLAN:  Discussed the following assessment and plan:  Sore throat - Plan: POC Rapid Strep A  Allergic rhinitis, unspecified seasonality, unspecified trigger  -given HPI and exam findings today, a serious infection or illness is unlikely. We discussed potential etiologies, with allergic rhinitis orVURI being most likely, and advised supportive care and  monitoring. Checked a strep test given household contact with strep.Was negative.We discussed treatment side effects, likely course, antibiotic misuse, transmission, and signs of developing a serious illness. -of course, we advised to return or notify a doctor immediately if symptoms worsen or persist or new concerns arise.    Patient Instructions  BEFORE YOU LEAVE: -rapid strep test -follow up: as needed  Zyrtec nightly  Flonase 2 sprays each nostril daily for 1 month then 1 spray each nostril daily.  I hope you are feeling better soon! Seek care immediately if worsening, new concerns or you are not improving with treatment.      Colin Benton R., DO

## 2016-12-07 LAB — POCT RAPID STREP A (OFFICE): RAPID STREP A SCREEN: NEGATIVE

## 2017-09-20 ENCOUNTER — Other Ambulatory Visit: Payer: Self-pay | Admitting: Obstetrics & Gynecology

## 2017-09-20 DIAGNOSIS — Z1231 Encounter for screening mammogram for malignant neoplasm of breast: Secondary | ICD-10-CM

## 2017-11-04 ENCOUNTER — Ambulatory Visit
Admission: RE | Admit: 2017-11-04 | Discharge: 2017-11-04 | Disposition: A | Discharge status: Home / Self Care | Payer: BLUE CROSS/BLUE SHIELD | Source: Ambulatory Visit | Attending: Obstetrics & Gynecology | Admitting: Obstetrics & Gynecology

## 2017-11-04 DIAGNOSIS — Z1231 Encounter for screening mammogram for malignant neoplasm of breast: Secondary | ICD-10-CM

## 2018-03-07 ENCOUNTER — Other Ambulatory Visit: Payer: Self-pay

## 2018-03-07 ENCOUNTER — Ambulatory Visit: Payer: Self-pay | Admitting: *Deleted

## 2018-03-07 DIAGNOSIS — R0602 Shortness of breath: Secondary | ICD-10-CM

## 2018-03-07 DIAGNOSIS — R002 Palpitations: Secondary | ICD-10-CM

## 2018-03-07 NOTE — Telephone Encounter (Signed)
Please see message.  Please advise.  OK to refer?

## 2018-03-07 NOTE — Telephone Encounter (Signed)
The pt called with chest tightness/pressure and pain on the right side of her chest which started a couple of days ago; she also has tingling in her left arm into her hand for about 2 days; she says that she has had both of these symptoms before along with shortness of breath; the pt says that she has not been sleeping well; the pt states that she has been seen by Dr Elease Hashimoto for this before; she would like a referral for Dr Rayann Heman, cardiology, and the purpose of this phone call was to get the referral; recommendations made per nurse triage protocol; she is normally seen by Dr Elease Hashimoto but he has no availability today; offered to make an appointment with another provider today,  but the pt declined since this is not new for her;  the pt is agreeable to making an appointment for evaluation since she is not sure if the problem is "heart, allergy related, or hormones"; pt offered and accepted appointment with Dr Elease Hashimoto 03/10/18 at 1620; she verbalized understanding and would like a call back; she can be contacted at  [phone]; will route to office for notification.   Reason for Disposition . [1] Chest pain(s) lasting a few seconds AND [2] persists > 3 days  Answer Assessment - Initial Assessment Questions 1. LOCATION: "Where does it hurt?"       Right chest 2. RADIATION: "Does the pain go anywhere else?" (e.g., into neck, jaw, arms, back)     no 3. ONSET: "When did the chest pain begin?" (Minutes, hours or days)      03/04/18 4. PATTERN "Does the pain come and go, or has it been constant since it started?"  "Does it get worse with exertion?"      Intermittent; not related to exersion 5. DURATION: "How long does it last" (e.g., seconds, minutes, hours)     hours 6. SEVERITY: "How bad is the pain?"  (e.g., Scale 1-10; mild, moderate, or severe)    - MILD (1-3): doesn't interfere with normal activities     - MODERATE (4-7): interferes with normal activities or awakens from sleep    - SEVERE  (8-10): excruciating pain, unable to do any normal activities       Mild to moderate 7. CARDIAC RISK FACTORS: "Do you have any history of heart problems or risk factors for heart disease?" (e.g., prior heart attack, angina; high blood pressure, diabetes, being overweight, high cholesterol, smoking, or strong family history of heart disease)     Family history of CHF; pt previously seen by Dr Joylene Grapes 8. PULMONARY RISK FACTORS: "Do you have any history of lung disease?"  (e.g., blood clots in lung, asthma, emphysema, birth control pills)     Yes, asthma 9. CAUSE: "What do you think is causing the chest pain?"     Maybe allergy/asthma 10. OTHER SYMPTOMS: "Do you have any other symptoms?" (e.g., dizziness, nausea, vomiting, sweating, fever, difficulty breathing, cough)       Tingling in left arm to hand 11. PREGNANCY: "Is there any chance you are pregnant?" "When was your last menstrual period?"       No  Protocols used: CHEST PAIN-A-AH

## 2018-03-07 NOTE — Telephone Encounter (Signed)
Referral has been placed.  Called patient and let her know that the referral has been placed and that we will see her Monday. Also advised for her to go to ER if her symptoms become worse. Patient verbalized an understanding.

## 2018-03-07 NOTE — Telephone Encounter (Signed)
OK to refer.

## 2018-03-10 ENCOUNTER — Ambulatory Visit: Payer: BLUE CROSS/BLUE SHIELD | Admitting: Family Medicine

## 2018-03-10 ENCOUNTER — Encounter: Payer: Self-pay | Admitting: Family Medicine

## 2018-03-10 ENCOUNTER — Other Ambulatory Visit: Payer: Self-pay

## 2018-03-10 VITALS — BP 98/68 | HR 66 | Temp 98.4°F | Ht 62.5 in | Wt 145.2 lb

## 2018-03-10 DIAGNOSIS — R0789 Other chest pain: Secondary | ICD-10-CM

## 2018-03-10 MED ORDER — ALBUTEROL SULFATE HFA 108 (90 BASE) MCG/ACT IN AERS: 2.0000 | INHALATION_SPRAY | RESPIRATORY_TRACT | 1 refills | Status: AC | PRN

## 2018-03-10 NOTE — Patient Instructions (Signed)
Nonspecific Chest Pain °Chest pain can be caused by many different conditions. There is always a chance that your pain could be related to something serious, such as a heart attack or a blood clot in your lungs. Chest pain can also be caused by conditions that are not life-threatening. If you have chest pain, it is very important to follow up with your health care provider. °What are the causes? °Causes of this condition include: °· Heartburn. °· Pneumonia or bronchitis. °· Anxiety or stress. °· Inflammation around your heart (pericarditis) or lung (pleuritis or pleurisy). °· A blood clot in your lung. °· A collapsed lung (pneumothorax). This can develop suddenly on its own (spontaneous pneumothorax) or from trauma to the chest. °· Shingles infection (varicella-zoster virus). °· Heart attack. °· Damage to the bones, muscles, and cartilage that make up your chest wall. This can include: °? Bruised bones due to injury. °? Strained muscles or cartilage due to frequent or repeated coughing or overwork. °? Fracture to one or more ribs. °? Sore cartilage due to inflammation (costochondritis). ° °What increases the risk? °Risk factors for this condition may include: °· Activities that increase your risk for trauma or injury to your chest. °· Respiratory infections or conditions that cause frequent coughing. °· Medical conditions or overeating that can cause heartburn. °· Heart disease or family history of heart disease. °· Conditions or health behaviors that increase your risk of developing a blood clot. °· Having had chicken pox (varicella zoster). ° °What are the signs or symptoms? °Chest pain can feel like: °· Burning or tingling on the surface of your chest or deep in your chest. °· Crushing, pressure, aching, or squeezing pain. °· Dull or sharp pain that is worse when you move, cough, or take a deep breath. °· Pain that is also felt in your back, neck, shoulder, or arm, or pain that spreads to any of these  areas. ° °Your chest pain may come and go, or it may stay constant. °How is this diagnosed? °Lab tests or other studies may be needed to find the cause of your pain. Your health care provider may have you take a test called an ECG (electrocardiogram). An ECG records your heartbeat patterns at the time the test is performed. You may also have other tests, such as: °· Transthoracic echocardiogram (TTE). In this test, sound waves are used to create a picture of the heart structures and to look at how blood flows through your heart. °· Transesophageal echocardiogram (TEE). This is a more advanced imaging test that takes images from inside your body. It allows your health care provider to see your heart in finer detail. °· Cardiac monitoring. This allows your health care provider to monitor your heart rate and rhythm in real time. °· Holter monitor. This is a portable device that records your heartbeat and can help to diagnose abnormal heartbeats. It allows your health care provider to track your heart activity for several days, if needed. °· Stress tests. These can be done through exercise or by taking medicine that makes your heart beat more quickly. °· Blood tests. °· Other imaging tests. ° °How is this treated? °Treatment depends on what is causing your chest pain. Treatment may include: °· Medicines. These may include: °? Acid blockers for heartburn. °? Anti-inflammatory medicine. °? Pain medicine for inflammatory conditions. °? Antibiotic medicine, if an infection is present. °? Medicines to dissolve blood clots. °? Medicines to treat coronary artery disease (CAD). °· Supportive care for conditions that   do not require medicines. This may include: °? Resting. °? Applying heat or cold packs to injured areas. °? Limiting activities until pain decreases. ° °Follow these instructions at home: °Medicines °· If you were prescribed an antibiotic, take it as told by your health care provider. Do not stop taking the  antibiotic even if you start to feel better. °· Take over-the-counter and prescription medicines only as told by your health care provider. °Lifestyle °· Do not use any products that contain nicotine or tobacco, such as cigarettes and e-cigarettes. If you need help quitting, ask your health care provider. °· Do not drink alcohol. °· Make lifestyle changes as directed by your health care provider. These may include: °? Getting regular exercise. Ask your health care provider to suggest some activities that are safe for you. °? Eating a heart-healthy diet. A registered dietitian can help you to learn healthy eating options. °? Maintaining a healthy weight. °? Managing diabetes, if necessary. °? Reducing stress, such as with yoga or relaxation techniques. °General instructions °· Avoid any activities that bring on chest pain. °· If heartburn is the cause for your chest pain, raise (elevate) the head of your bed about 6 inches (15 cm) by putting blocks under the legs. Sleeping with more pillows does not effectively relieve heartburn because it only changes the position of your head. °· Keep all follow-up visits as told by your health care provider. This is important. This includes any further testing if your chest pain does not go away. °Contact a health care provider if: °· Your chest pain does not go away. °· You have a rash with blisters on your chest. °· You have a fever. °· You have chills. °Get help right away if: °· Your chest pain is worse. °· You have a cough that gets worse, or you cough up blood. °· You have severe pain in your abdomen. °· You have severe weakness. °· You faint. °· You have sudden, unexplained chest discomfort. °· You have sudden, unexplained discomfort in your arms, back, neck, or jaw. °· You have shortness of breath at any time. °· You suddenly start to sweat, or your skin gets clammy. °· You feel nauseous or you vomit. °· You suddenly feel light-headed or dizzy. °· Your heart begins to beat  quickly, or it feels like it is skipping beats. °These symptoms may represent a serious problem that is an emergency. Do not wait to see if the symptoms will go away. Get medical help right away. Call your local emergency services (911 in the [place] Do not drive yourself to the hospital. °This information is not intended to replace advice given to you by your health care provider. Make sure you discuss any questions you have with your health care provider. °Document Released: 01/24/2005 Document Revised: 01/09/2016 Document Reviewed: 01/09/2016 °Elsevier Interactive Patient Education © 2017 [employer] ° °

## 2018-03-10 NOTE — Progress Notes (Signed)
Subjective:     Patient ID: Kari Stephens, female   DOB: 1970-09-02, 47 y.o.   MRN: [id number]  HPI Patient seen with onset last Wednesday of some substernal chest pressure and tingling sensation in the left upper extremity.  Her symptoms have been relatively constant.  She has had somewhat similar symptoms in the past.  She is continue to exercise and in fact ran 3 miles of interval training earlier today without any difficulty.  She feels that her symptoms are somewhat better when outdoors in cooler air.  She used her daughter's albuterol inhaler once which seemed to help.  She is not aware of any wheezing or cough.  No fevers or chills.  No pleuritic pain.  No GERD symptoms.  No chest pain whatsoever with exertion.  No increased exertional dyspnea.  Denies any lower extremity paresthesias.  No extremity weakness.  No slurred speech.  No family history of premature CAD.  Patient is non-smoker.  No history of hypertension.  Has seen cardiology in past.  Had Echo and event monitor in 2011 which were unremarkable.  Past Medical History:  Diagnosis Date  . Anemia   . Asthma with hay fever    rarely uses inhaler  . B12 deficiency   . C. difficile diarrhea    during pregnancy, resolved  . Hemorrhoids   . IBS (irritable bowel syndrome)   . IC (interstitial cystitis)    controlled  . Lipoma   . Seasonal allergies    Past Surgical History:  Procedure Laterality Date  . CESAREAN SECTION     x 2  . DILATION AND CURETTAGE OF UTERUS    . DILITATION & CURRETTAGE/HYSTROSCOPY WITH NOVASURE ABLATION N/A 12/23/2012   Procedure: DILATATION & CURETTAGE/HYSTEROSCOPY WITH NOVASURE ABLATION ;  Surgeon: [name], MD;  Location: [facility];  Service: Gynecology;  Laterality: N/A;  . endometrial polyp removal    . TUBAL LIGATION      reports that she has never smoked. She has never used smokeless tobacco. She reports that she drinks about 7.0 standard drinks of alcohol per week. She reports  that she does not use drugs. family history includes Breast cancer in her maternal aunt; Cancer in her father; Crohn's disease in her brother; Irregular heart beat in her cousin; Irritable bowel syndrome in her brother; Supraventricular tachycardia in her cousin; Thyroid disease in her mother. Allergies  Allergen Reactions  . Sulfa Antibiotics Nausea And Vomiting     Review of Systems  Constitutional: Negative for appetite change, chills, fever and unexpected weight change.  Respiratory: Negative for cough, shortness of breath and wheezing.   Cardiovascular: Positive for chest pain. Negative for palpitations and leg swelling.  Gastrointestinal: Negative for abdominal pain.  Musculoskeletal: Negative for neck pain.  Neurological: Negative for seizures, syncope, weakness and headaches.       Objective:   Physical Exam  Constitutional: She appears well-developed and well-nourished.  Cardiovascular: Normal rate, regular rhythm and normal pulses.  Pulmonary/Chest: Effort normal and breath sounds normal. No respiratory distress. She has no decreased breath sounds. She has no wheezes. She has no rales.  Musculoskeletal:       Right lower leg: She exhibits no edema.       Left lower leg: She exhibits no edema.       Assessment:     Atypical chest pain.  Non-exertional.  EKG shows sinus rhythm with no acute changes.    Plan:     Pt already has referral  pending to cardiology.  Follow up promptly for any increased dyspnea, exertional chest symptoms, or other concerns.  Eulas Post MD [facility]

## 2018-03-14 ENCOUNTER — Telehealth: Payer: Self-pay | Admitting: *Deleted

## 2018-03-14 NOTE — Telephone Encounter (Signed)
Called patient and she has an appointment on 04/07/18 to see Dr. Shirlee More. Patient wanted [place] to be aware.

## 2018-03-14 NOTE — Telephone Encounter (Signed)
Copied from [facility] [id number]. Topic: General - Other >> Mar 14, 2018 11:10 AM Leward Quan A wrote: Reason for CRM: Patient called to say that she made an appointment to see Dr Shirlee More at [facility] and would like that noted in her chart also ask for a call back. Ph# [phone]

## 2018-04-02 ENCOUNTER — Encounter: Payer: Self-pay | Admitting: Internal Medicine

## 2018-04-02 ENCOUNTER — Ambulatory Visit: Payer: Managed Care, Other (non HMO) | Admitting: Internal Medicine

## 2018-04-02 VITALS — BP 112/70 | HR 65 | Ht 64.0 in | Wt 146.8 lb

## 2018-04-02 DIAGNOSIS — R002 Palpitations: Secondary | ICD-10-CM | POA: Diagnosis not present

## 2018-04-02 DIAGNOSIS — R0789 Other chest pain: Secondary | ICD-10-CM

## 2018-04-02 NOTE — Patient Instructions (Signed)
Medication Instructions:  Your physician recommends that you continue on your current medications as directed. Please refer to the Current Medication list given to you today.  Labwork: None ordered.  Testing/Procedures: Your physician has requested that you have an exercise tolerance test. For further information please visit [email or web address]. Please also follow instruction sheet, as given.   Follow-Up:  Dr Rayann Heman will follow up with you based off your exercise stress test results.   Any Other Special Instructions Will Be Listed Below (If Applicable).    If you need a refill on your cardiac medications before your next appointment, please call your pharmacy.

## 2018-04-02 NOTE — Progress Notes (Signed)
Electrophysiology Office Note   Date:  04/02/2018   ID:  Kari Stephens, DOB [birth year], MRN [id number]  PCP:  Eulas Post, MD  Cardiologist:  Dr Acie Fredrickson Primary Electrophysiologist: Thompson Grayer, MD    CC: chest pain   History of Present Illness: Kari Stephens is a 47 y.o. female who presents today for cardiology assessment.  I have not seen her in over 3 years.  She is referred by Dr Elease Hashimoto for symptoms of chest pain. She was evaluated by Dr Elease Hashimoto 03/10/18 (his note reviewed).  She was felt to have atypical chest pain, similar to prior.  She describes tingling and numbness in her L arm, neck and scalp. She has chest tightness at rest.  She does not have exertional symptoms.   There were no acute ekg changes.  She was referred to Cardiology.   Today, she denies symptoms of palpitations, shortness of breath, orthopnea, PND, lower extremity edema, claudication, dizziness, presyncope, syncope, bleeding, or neurologic sequela. The patient is tolerating medications without difficulties and is otherwise without complaint today.    Past Medical History:  Diagnosis Date  . Anemia   . Asthma with hay fever    rarely uses inhaler  . B12 deficiency   . C. difficile diarrhea    during pregnancy, resolved  . Hemorrhoids   . IBS (irritable bowel syndrome)   . IC (interstitial cystitis)    controlled  . Lipoma   . Seasonal allergies    Past Surgical History:  Procedure Laterality Date  . CESAREAN SECTION     x 2  . DILATION AND CURETTAGE OF UTERUS    . DILITATION & CURRETTAGE/HYSTROSCOPY WITH NOVASURE ABLATION N/A 12/23/2012   Procedure: DILATATION & CURETTAGE/HYSTEROSCOPY WITH NOVASURE ABLATION ;  Surgeon: [name], MD;  Location: [facility];  Service: Gynecology;  Laterality: N/A;  . endometrial polyp removal    . TUBAL LIGATION       Current Outpatient Medications  Medication Sig Dispense Refill  . albuterol (PROVENTIL HFA;VENTOLIN HFA) 108 (90 Base)  MCG/ACT inhaler Inhale 2 puffs into the lungs every 4 (four) hours as needed for wheezing or shortness of breath. 6.7 g 1  . Cyanocobalamin (VITAMIN B-12 PO) Take by mouth every other day.    . Probiotic Product (ALIGN PO) Take 1 capsule by mouth daily as needed (uses two weeks every month). For ibs symptoms     No current facility-administered medications for this visit.     Allergies:   Sulfa antibiotics   Social History:  The patient  reports that she has never smoked. She has never used smokeless tobacco. She reports that she drinks about 7.0 standard drinks of alcohol per week. She reports that she does not use drugs.   Family History:  The patient's  family history includes Breast cancer in her maternal aunt; Cancer in her father; Crohn's disease in her brother; Irregular heart beat in her cousin; Irritable bowel syndrome in her brother; Supraventricular tachycardia in her cousin; Thyroid disease in her mother.    ROS:  Please see the history of present illness.   All other systems are personally reviewed and negative.    PHYSICAL EXAM: VS:  BP 112/70   Pulse 65   Ht 5\' 4"  (1.626 m)   Wt 146 lb 12.8 oz (66.6 kg)   SpO2 98%   BMI 25.20 kg/m  , BMI Body mass index is 25.2 kg/m. GEN: Well nourished, well developed, in no acute distress  HEENT: normal  Neck: no JVD, carotid bruits, or masses Cardiac: RRR; no murmurs, rubs, or gallops,no edema  Respiratory:  clear to auscultation bilaterally, normal work of breathing GI: soft, nontender, nondistended, + BS MS: no deformity or atrophy  Skin: warm and dry  Neuro:  Strength and sensation are intact Psych: euthymic mood, full affect  EKG:  EKG is ordered today. The ekg ordered today is personally reviewed and shows sinus rhythm, nonspecific St/ T changes    Wt Readings from Last 3 Encounters:  04/02/18 146 lb 12.8 oz (66.6 kg)  03/10/18 145 lb 3.2 oz (65.9 kg)  12/06/16 145 lb 9.6 oz (66 kg)      Other studies personally  reviewed: Additional studies/ records that were reviewed today include: Dr Anastasio Auerbach' notes,   Review of the above records today demonstrates: as above   ASSESSMENT AND PLAN:  1.  Atypical chest pain and palpitations No symptoms to suggest sustained arrhythmias or SVT Symptoms are not typical for CAD. I suspect that stress, anxiety may be at play.  Exercise treadmill is ordered.  If low risk, no further CV testing is advised. We discussed lifestyle modification including regular exercise, adequate sleep, yoga, and stress avoidance at length today.   Follow-up with PCP  I will see as needed if treadmill is low risk.  Current medicines are reviewed at length with the patient today.   The patient does not have concerns regarding her medicines.  The following changes were made today:  none    Signed, Thompson Grayer, MD  04/02/2018 10:22 AM     [facility] [address] [place] [address] [phone] (office) [phone] (fax)

## 2018-04-10 ENCOUNTER — Ambulatory Visit (INDEPENDENT_AMBULATORY_CARE_PROVIDER_SITE_OTHER): Payer: Managed Care, Other (non HMO)

## 2018-04-10 DIAGNOSIS — R002 Palpitations: Secondary | ICD-10-CM | POA: Diagnosis not present

## 2018-04-10 LAB — EXERCISE TOLERANCE TEST
Estimated workload: 15.1 METS
Exercise duration (min): 13 min
Exercise duration (sec): 0 s
MPHR: 174 {beats}/min
Peak HR: 173 {beats}/min
Percent HR: 99 %
RPE: 16
Rest HR: 68 {beats}/min

## 2018-04-16 ENCOUNTER — Telehealth: Payer: Self-pay | Admitting: Internal Medicine

## 2018-04-16 NOTE — Telephone Encounter (Signed)
Patient called for results to her stress test.

## 2018-04-16 NOTE — Telephone Encounter (Signed)
-----   Message from Thompson Grayer, MD sent at 04/16/2018  4:16 PM EST ----- Results reviewed.  [name], please inform pt of result.  Normal results.  No further CV testing is advised.  Regular exercise is encouraged. I will route to primary care also.

## 2018-04-16 NOTE — Telephone Encounter (Signed)
Returned call to Pt.  Advised that stress test was normal and no further CV testing was advised.    Pt concerned about arm numbness.  Advised Pt to follow up with her PCP for further work up.  Pt indicates understanding.

## 2018-04-21 ENCOUNTER — Ambulatory Visit: Payer: Self-pay

## 2018-04-21 NOTE — Telephone Encounter (Signed)
Patient called and says that she feels like she needs some testing done to see why she's having numbness/tingling/pain in her arm, chest tightness, slow thought processes (not able to gather her words), dizziness at times. She says she always have chest tightness and all these symptoms have been ongoing for the past month. She says she saw Dr. Elease Hashimoto, then the cardiologist and nothing showed up. She says she wants to get on the books to see Dr. Elease Hashimoto and have it noted that she wants further testing to pin point exactly what is going on. I asked about new symptoms, she denies any new symptoms. Appointment scheduled for Friday, 04/25/18 at 0740 with Dr. Elease Hashimoto.   Reason for Disposition . [1] Numbness or tingling in one or both hands AND [2] is a chronic symptom (recurrent or ongoing AND present > 4 weeks)  Answer Assessment - Initial Assessment Questions 1. SYMPTOM: "What is the main symptom you are concerned about?" (e.g., weakness, numbness)     Numbness left arm, tingling to the back of head, feel lightheaded, loss for words, feel off in general 2. ONSET: "When did this start?" (minutes, hours, days; while sleeping)     1 month ago 3. LAST NORMAL: "When was the last time you were normal (no symptoms)?"     1 month ago 4. PATTERN "Does this come and go, or has it been constant since it started?"  "Is it present now?"     More persistent, constant; heighten symptoms when menstrual period is on  5. CARDIAC SYMPTOMS: "Have you had any of the following symptoms: chest pain, difficulty breathing, palpitations?"    Yes, all of the above-chest pain (not now), but ongoing chest tightness now; palpitations (not now); difficulty breathing (not now) 6. 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 OTHER SYMPTOMS: "Do you have any other symptoms?"     No 8. PREGNANCY: "Is there any chance you are pregnant?" "When was your last menstrual period?"     N/A  Protocols used: NEUROLOGIC DEFICIT-A-AH

## 2018-04-21 NOTE — Telephone Encounter (Signed)
Call placed to patient. Left VM to call office.

## 2018-04-25 ENCOUNTER — Ambulatory Visit (INDEPENDENT_AMBULATORY_CARE_PROVIDER_SITE_OTHER): Payer: 59 | Admitting: Family Medicine

## 2018-04-25 ENCOUNTER — Other Ambulatory Visit: Payer: Self-pay

## 2018-04-25 ENCOUNTER — Telehealth: Payer: Self-pay | Admitting: Family Medicine

## 2018-04-25 ENCOUNTER — Encounter: Payer: Self-pay | Admitting: Family Medicine

## 2018-04-25 VITALS — BP 100/64 | HR 64 | Temp 98.3°F | Ht 64.0 in | Wt 148.7 lb

## 2018-04-25 DIAGNOSIS — R202 Paresthesia of skin: Secondary | ICD-10-CM | POA: Diagnosis not present

## 2018-04-25 DIAGNOSIS — R0789 Other chest pain: Secondary | ICD-10-CM

## 2018-04-25 NOTE — Telephone Encounter (Signed)
Actually she saw Dr. Elease Hashimoto

## 2018-04-25 NOTE — Progress Notes (Addendum)
Subjective:     Patient ID: Kari Stephens, female   DOB: 1970-11-13, 47 y.o.   MRN: [id number]  HPI Patient has long history of palpitations and atypical chest pain.  She saw cardiologist recently and had exercise tolerance test which came back normal.  She has been exercising without difficulty.  Has not had any consistent exertional chest pain.  Her major complaint today is intermittent paresthesias left upper extremity.  No clear exacerbating factors.  No neck pain.  She describes a tingling and occasionally warm sensation left upper extremity from the upper arm all the way down to the wrist.  No hand pain.  She is not aware of any definite weakness.  Symptoms are worse during the daytime.  Sometimes feels more anxious during episodes.  She has not had any consistent headaches, speech changes, focal weakness, tremors, or any visual changes.  Past Medical History:  Diagnosis Date  . Anemia   . Asthma with hay fever    rarely uses inhaler  . B12 deficiency   . C. difficile diarrhea    during pregnancy, resolved  . Hemorrhoids   . IBS (irritable bowel syndrome)   . IC (interstitial cystitis)    controlled  . Lipoma   . Seasonal allergies    Past Surgical History:  Procedure Laterality Date  . CESAREAN SECTION     x 2  . DILATION AND CURETTAGE OF UTERUS    . DILITATION & CURRETTAGE/HYSTROSCOPY WITH NOVASURE ABLATION N/A 12/23/2012   Procedure: DILATATION & CURETTAGE/HYSTEROSCOPY WITH NOVASURE ABLATION ;  Surgeon: [name], MD;  Location: [facility];  Service: Gynecology;  Laterality: N/A;  . endometrial polyp removal    . TUBAL LIGATION      reports that she has never smoked. She has never used smokeless tobacco. She reports current alcohol use of about 7.0 standard drinks of alcohol per week. She reports that she does not use drugs. family history includes Breast cancer in her maternal aunt; Cancer in her father; Crohn's disease in her brother; Irregular heart beat in  her cousin; Irritable bowel syndrome in her brother; Supraventricular tachycardia in her cousin; Thyroid disease in her mother. Allergies  Allergen Reactions  . Sulfa Antibiotics Nausea And Vomiting     Review of Systems  Constitutional: Negative for chills and fever.  Eyes: Negative for visual disturbance.  Respiratory: Negative for shortness of breath and wheezing.   Musculoskeletal: Negative for back pain and neck pain.  Neurological: Negative for dizziness, tremors, seizures, facial asymmetry, speech difficulty, weakness and headaches.       Objective:   Physical Exam Constitutional:      Appearance: Normal appearance.  Neck:     Musculoskeletal: Normal range of motion and neck supple.  Cardiovascular:     Rate and Rhythm: Normal rate and regular rhythm.     Heart sounds: No murmur. No gallop.   Pulmonary:     Effort: Pulmonary effort is normal.     Breath sounds: Normal breath sounds. No wheezing or rales.  Neurological:     General: No focal deficit present.     Mental Status: She is alert.     Cranial Nerves: No cranial nerve deficit.     Comments: Normal sensory function throughout upper extremities.  She has 1+ reflexes upper extremities bilaterally.  Full strength throughout.        Assessment:     #1 recent atypical chest pain currently stable with negative exercise tolerance test  #2 intermittent  paresthesias left upper extremity.  She does not have any neck pain to suggest likely nerve impingement.  Clinically, does not describe carpal tunnel type symptoms.  Neuro exam is nonfocal    Plan:     -Watch for any weakness, neck pain, or any progressive numbness -We gave patient option of further neurologic evaluation.  May benefit from nerve conduction velocities as next step in trying to sort this out.  We did not see any clear indication to set up MRI of the brain or neck at this time. -Patient will think this over and consider whether she would like to pursue  further neurologic work-up for any persistent symptoms  Eulas Post MD [facility]  05-01-18 addendum:  Pt has decided she would like to pursue further diagnostics.  I have put in order for NCVs LUE through neurology to further assess.  Eulas Post MD [facility]

## 2018-04-25 NOTE — Patient Instructions (Signed)
Paresthesia Paresthesia is an abnormal burning or prickling sensation. It is usually felt in the hands, arms, legs, or feet. However, it may occur in any part of the body. Usually, paresthesia is not painful. It may feel like:  Tingling or numbness.  Pins and needles.  Skin crawling.  Buzzing.  Arms or legs falling asleep.  Itching. Paresthesia may occur without any clear cause, or it may be caused by:  Breathing too quickly (hyperventilation).  Pressure on a nerve.  An underlying medical condition.  Side effects of a medication.  Nutritional deficiencies.  Exposure to toxic chemicals. Most people experience temporary (transient) paresthesia at some time in their lives. For some people, it may be long-lasting (chronic) because of an underlying medical condition. If you have paresthesia that lasts a long time, you may need to be evaluated by your health care provider. Follow these instructions at home: Alcohol use   Do not drink alcohol if: ? Your health care provider tells you not to drink. ? You are pregnant, may be pregnant, or are planning to become pregnant.  If you drink alcohol, limit how much you have: ? 0-1 drink a day for women. ? 0-2 drinks a day for men.  Be aware of how much alcohol is in your drink. In the [place] one drink equals one typical bottle of beer (12 oz), one-half glass of wine (5 oz), or one shot of hard liquor (1 oz). Nutrition  Eat a healthy diet. This includes: ? Eating foods that are high in fiber, such as fresh fruits and vegetables, whole grains, and beans. ? Limiting foods that are high in fat and processed sugars, such as fried or sweet foods. General instructions  Take over-the-counter and prescription medicines only as told by your health care provider.  Do not use any products that contain nicotine or tobacco, such as cigarettes and e-cigarettes. These can keep blood from reaching damaged nerves. If you need help quitting, ask your  health care provider.  If you have diabetes, work closely with your health care provider to keep your blood sugar under control.  If you have numbness in your feet: ? Check every day for signs of injury or infection. Watch for redness, warmth, and swelling. ? Wear padded socks and comfortable shoes. These help protect your feet.  Keep all follow-up visits as told by your health care provider. This is important. Contact a health care provider if you:  Have paresthesia that gets worse or does not go away.  Have a burning or prickling feeling that gets worse when you walk.  Have pain, cramps, or dizziness.  Develop a rash. Get help right away if you:  Feel weak.  Have trouble walking or moving.  Have problems with speech, understanding, or vision.  Feel confused.  Cannot control your bladder or bowel movements.  Have numbness after an injury.  Develop new weakness in an arm or leg.  Faint. Summary  Paresthesia is an abnormal burning or prickling sensation that is usually felt in the hands, arms, legs, or feet. It may also occur in other parts of the body.  Paresthesia may occur without any clear cause, or it may be caused by breathing too quickly (hyperventilation), pressure on a nerve, an underlying medical condition, side effects of a medication, nutritional deficiencies, or exposure to toxic chemicals.  If you have paresthesia that lasts a long time, you may need to be evaluated by your health care provider. This information is not intended to replace  advice given to you by your health care provider. Make sure you discuss any questions you have with your health care provider. Document Released: 04/06/2002 Document Revised: 04/25/2017 Document Reviewed: 04/25/2017 Elsevier Interactive Patient Education  2019 [place] over if you are willing/interested in seeing neurologist.

## 2018-04-25 NOTE — Telephone Encounter (Signed)
Dr. Sarajane Jews please advise on the MRI order thanks

## 2018-04-25 NOTE — Telephone Encounter (Signed)
Copied from [place] [id number]. Topic: Quick Communication - See Telephone Encounter >> Apr 25, 2018  9:14 AM Bea Graff, NT wrote: CRM for notification. See Telephone encounter for: 04/25/18. Pt states Dr. Sarajane Jews mentioned during her appt today about the pt having a MRI. Pt has decided she would like to go through with having the MRI and requesting the order put in. She would like to see which location would be best with her [employer] and cost.

## 2018-04-29 ENCOUNTER — Telehealth: Payer: Self-pay

## 2018-04-29 NOTE — Telephone Encounter (Signed)
Called patient and gave her message from Dr. Elease Hashimoto.

## 2018-04-29 NOTE — Telephone Encounter (Signed)
Please see message.  Please advise. 

## 2018-04-29 NOTE — Telephone Encounter (Signed)
Let her know I will set up.  Thanks.

## 2018-04-29 NOTE — Telephone Encounter (Signed)
Copied from [place] [phone]. Topic: General - Other >> Apr 29, 2018 10:22 AM Leward Quan A wrote: Reason for CRM: Patient called back to say that after discussion with Dr Sheldon Silvan she is open to going on with the Nerve conduction test. Asking for a call back with next step or appointment for the test. Ph# [phone]

## 2018-05-01 NOTE — Telephone Encounter (Signed)
Let her know I have put in referral for nerve conduction velocities to evaluate her LUE paresthesias.

## 2018-05-01 NOTE — Addendum Note (Signed)
Addended by: Eulas Post on: 05/01/2018 02:30 PM   Modules accepted: Orders

## 2018-05-01 NOTE — Telephone Encounter (Signed)
Patient is aware of test referral per call note on 04/29/18.

## 2018-05-20 ENCOUNTER — Other Ambulatory Visit: Payer: Self-pay

## 2018-05-20 DIAGNOSIS — R202 Paresthesia of skin: Secondary | ICD-10-CM

## 2018-05-20 DIAGNOSIS — R0789 Other chest pain: Secondary | ICD-10-CM

## 2018-05-21 ENCOUNTER — Encounter: Payer: Self-pay | Admitting: Neurology

## 2018-05-26 ENCOUNTER — Ambulatory Visit: Payer: 59 | Admitting: Physician Assistant

## 2018-05-26 ENCOUNTER — Ambulatory Visit: Payer: Self-pay | Admitting: *Deleted

## 2018-05-26 ENCOUNTER — Encounter: Payer: Self-pay | Admitting: Physician Assistant

## 2018-05-26 ENCOUNTER — Other Ambulatory Visit: Payer: Self-pay

## 2018-05-26 VITALS — BP 116/80 | HR 68 | Temp 98.1°F | Resp 16 | Ht 64.0 in | Wt 148.0 lb

## 2018-05-26 DIAGNOSIS — J069 Acute upper respiratory infection, unspecified: Secondary | ICD-10-CM | POA: Diagnosis not present

## 2018-05-26 DIAGNOSIS — B9789 Other viral agents as the cause of diseases classified elsewhere: Secondary | ICD-10-CM

## 2018-05-26 MED ORDER — BENZONATATE 100 MG PO CAPS: 100.0000 mg | ORAL_CAPSULE | Freq: Three times a day (TID) | ORAL | 0 refills | Status: DC | PRN

## 2018-05-26 NOTE — Progress Notes (Signed)
Patient presents to clinic today c/o URI symptoms. Notes that she initially started Tuesday after returning for trip. Pt endorses cough - initially productive with yellow sputum, body aches, fever (max 102), congestion, chest tightness ( albuterol inhaler provides relief). Pt denies nausea, vomiting, diarrhea. Notes last fever was yesterday and borderline at 99.5. None since. Aches, and productivity of cough have resolved. Now just with mild chest tightness. Feels symptoms are improving but she was just making sure she did not have the flu.   Past Medical History:  Diagnosis Date  . Anemia   . Asthma with hay fever    rarely uses inhaler  . B12 deficiency   . C. difficile diarrhea    during pregnancy, resolved  . Hemorrhoids   . IBS (irritable bowel syndrome)   . IC (interstitial cystitis)    controlled  . Lipoma   . Seasonal allergies     Current Outpatient Medications on File Prior to Visit  Medication Sig Dispense Refill  . albuterol (PROVENTIL HFA;VENTOLIN HFA) 108 (90 Base) MCG/ACT inhaler Inhale 2 puffs into the lungs every 4 (four) hours as needed for wheezing or shortness of breath. 6.7 g 1  . Cyanocobalamin (VITAMIN B-12 PO) Take by mouth every other day.    . Probiotic Product (ALIGN PO) Take 1 capsule by mouth daily as needed (uses two weeks every month). For ibs symptoms     No current facility-administered medications on file prior to visit.     Allergies  Allergen Reactions  . Sulfa Antibiotics Nausea And Vomiting    Family History  Problem Relation Age of Onset  . Crohn's disease Brother   . Cancer Father        bladder  . Irritable bowel syndrome Brother   . Irregular heart beat Cousin        resolved  . Supraventricular tachycardia Cousin        s/p ablation  . Thyroid disease Mother   . Breast cancer Maternal Aunt   . Colon cancer Neg Hx     Social History   Socioeconomic History  . Marital status: Married    Spouse name: Not on file  .  Number of children: Not on file  . Years of education: Not on file  . Highest education level: Not on file  Occupational History  . Not on file  Social Needs  . Financial resource strain: Not on file  . Food insecurity:    Worry: Not on file    Inability: Not on file  . Transportation needs:    Medical: Not on file    Non-medical: Not on file  Tobacco Use  . Smoking status: Never Smoker  . Smokeless tobacco: Never Used  Substance and Sexual Activity  . Alcohol use: Yes    Alcohol/week: 7.0 standard drinks    Types: 7 Glasses of wine per week    Comment: 1 glass of wine most days  . Drug use: No  . Sexual activity: Yes    Birth control/protection: Surgical  Lifestyle  . Physical activity:    Days per week: Not on file    Minutes per session: Not on file  . Stress: Not on file  Relationships  . Social connections:    Talks on phone: Not on file    Gets together: Not on file    Attends religious service: Not on file    Active member of club or organization: Not on file    Attends meetings  of clubs or organizations: Not on file    Relationship status: Not on file  Other Topics Concern  . Not on file  Social History Narrative   Lives in [place].     Proficient Engineering geologist.   Lives with spouse and 2 children (ages 67 and 42)   Review of Systems - See HPI.  All other ROS are negative.  BP 116/80   Pulse 68   Temp 98.1 F (36.7 C) (Oral)   Resp 16   Ht 5\' 4"  (1.626 m)   Wt 148 lb (67.1 kg)   SpO2 98%   BMI 25.40 kg/m   Physical Exam Constitutional:      Appearance: Normal appearance.  HENT:     Head: Normocephalic.     Right Ear: Tympanic membrane is bulging.     Mouth/Throat:     Mouth: Mucous membranes are moist.     Pharynx: Oropharynx is clear. Uvula midline.  Cardiovascular:     Rate and Rhythm: Normal rate and regular rhythm.     Heart sounds: Normal heart sounds.  Pulmonary:     Effort: Pulmonary effort is normal.     Breath sounds:  Normal breath sounds.  Neurological:     Mental Status: She is alert and oriented to person, place, and time.    Recent Results (from the past 2160 hour(s))  Exercise Tolerance Test     Status: None   Collection Time: 04/10/18 11:56 AM  Result Value Ref Range   Rest HR 68 bpm   Rest BP 118/74 mmHg   RPE 16    Exercise duration (sec) 0 sec   Percent HR 99 %   Exercise duration (min) 13 min   Estimated workload 15.1 METS   Peak HR 173 bpm   Peak BP 143/78 mmHg   MPHR 174 bpm   Assessment/Plan: 1. Viral URI with cough Improving symptoms. Afebrile. Lungs CTAB. Vitals stable. Discussed outside window for flu treatment. Could potentially be a flu-like illness but is thankfully improving. Will rx Tessalon to help with cough. Continue albuterol inhaler and Theraflu. Strict return precautions reviewed with patient. Call immediately for recurrence of fever or any deterioration in symptoms.    Leeanne Rio, PA-C

## 2018-05-26 NOTE — Telephone Encounter (Signed)
Contacted pt regarding her symptoms; the pt says that she has a productive cough gray-yellow secretion; she is has a headache rated 1 out 10; nasal and head congestion; her temp was 101.3 pm 05/26/2018; this am it was 99.8; the pt says that she has chest tightness and wheezing but her emergence inhaler helped; she says that she started feeling unwell on 05/20/2018; her symptoms worsened on 05/25/2018; the pt uses [facility]; recommendations made per nurse triage protocol; the pt would like to have something called in for her; she would like to know if it is too late for her to get tamiflu; the pt is agreeable to coming into office but would prefer that something be called in for her; spoke with Miquel Dunn and she requests that this information be routed to the office for provider review; pt verbalized understanding; she can be contacted at [phone]; will route to office per request; the pt was last seen by Dr Elease Hashimoto. [facility], 04/25/2018      Reason for Disposition . Fever present > 3 days (72 hours)  Answer Assessment - Initial Assessment Questions 1. TYPE of EXPOSURE: "How were you exposed?" (e.g., close contact, not a close contact)     Pt's daughters friends 2. DATE of EXPOSURE: "When did the exposure occur?" (e.g., hour, days, weeks)     2 days prior to trip 3. PREGNANCY: "Is there any chance you are pregnant?" "When was your last menstrual period?"     No LMP 05/25/2018 4. HIGH RISK for COMPLICATIONS: "Do you have any heart or lung problems? Do you have a weakened immune system?" (e.g., CHF, COPD, asthma, HIV positive, chemotherapy, renal failure, diabetes mellitus, sickle cell anemia)     asthma 5. SYMPTOMS: "Do you have any symptoms?" (e.g., cough, fever, sore throat, difficulty breathing).     Cough, fever, chills, nasal and head congestion  Answer Assessment - Initial Assessment Questions 1. WORST SYMPTOM: "What is your worst symptom?" (e.g., cough, runny nose, muscle aches,  headache, sore throat, fever)      Weakness, coughing at night keeps pt up 2. ONSET: "When did your flu symptoms start?"      05/20/2018 3. COUGH: "How bad is the cough?"       Severe; cough keeps pt up at night 4. RESPIRATORY DISTRESS: "Describe your breathing."      Chest tightness and wheezing 5. FEVER: "Do you have a fever?" If so, ask: "What is your temperature, how was it measured, and when did it start?"     99.8 after advil 6. EXPOSURE: "Were you exposed to someone with influenza?"       yes 7. FLU VACCINE: "Did you get a flu shot this year?"     no 8. HIGH RISK DISEASE: "Do you any chronic medical problems?" (e.g., heart or lung disease, asthma, weak immune system, or other HIGH RISK conditions)     asthma 9. PREGNANCY: "Is there any chance you are pregnant?" "When was your last menstrual period?"     No LMP 05/25/2018 10. OTHER SYMPTOMS: "Do you have any other symptoms?"  (e.g., runny nose, muscle aches, headache, sore throat)       Runny nose, nasal and head congestion, productive cough, chills  Protocols used: INFLUENZA - SEASONAL-A-AH, INFLUENZA EXPOSURE-A-AH

## 2018-05-26 NOTE — Telephone Encounter (Signed)
Please see message. °

## 2018-05-26 NOTE — Telephone Encounter (Signed)
Really feel she should be assessed  Need to rule out pneumonia.

## 2018-05-26 NOTE — Telephone Encounter (Signed)
FYI: Scheduled patient to see Elyn Aquas today at 3:30pm.

## 2018-05-26 NOTE — Patient Instructions (Signed)
Please keep well-hydrated and get plenty of rest. Continue your saline rinses, flonase and theraflu. Take the tessalon as directed for cough.  This seems viral, especially giving that symptoms are easing up since the weekend.  Temperature may resurface at night or early morning for a couple of days. Symptoms should continue to improve from this point forward.  Call me ASAP for any worsening or new symptoms.

## 2018-06-03 ENCOUNTER — Encounter: Payer: 59 | Admitting: Neurology

## 2018-12-26 ENCOUNTER — Telehealth (INDEPENDENT_AMBULATORY_CARE_PROVIDER_SITE_OTHER): Payer: Managed Care, Other (non HMO) | Admitting: Family Medicine

## 2018-12-26 ENCOUNTER — Other Ambulatory Visit: Payer: Self-pay

## 2018-12-26 DIAGNOSIS — M79674 Pain in right toe(s): Secondary | ICD-10-CM

## 2018-12-26 DIAGNOSIS — Z8639 Personal history of other endocrine, nutritional and metabolic disease: Secondary | ICD-10-CM

## 2018-12-26 DIAGNOSIS — M2550 Pain in unspecified joint: Secondary | ICD-10-CM

## 2018-12-26 DIAGNOSIS — R5383 Other fatigue: Secondary | ICD-10-CM | POA: Diagnosis not present

## 2018-12-26 NOTE — Progress Notes (Signed)
This visit type was conducted due to national recommendations for restrictions regarding the COVID-19 pandemic in an effort to limit this patient's exposure and mitigate transmission in our community.   Virtual Visit via Video Note  I connected with Kari Stephens on 12/26/18 at  1:15 PM EDT by a video enabled telemedicine application and verified that I am speaking with the correct person using two identifiers.  Location patient: home Location provider:work or home office Persons participating in the virtual visit: patient, provider  I discussed the limitations of evaluation and management by telemedicine and the availability of in person appointments. The patient expressed understanding and agreed to proceed.   HPI: Patient had recent urgent care visit for right great toe pain.  No prior history of gout.  No specific injury though she recently acquired some new running shoes.  She also thinks she may have stumped her toe at some point.  In any event she had x-rays and these were reportedly negative for acute bony abnormality.  She was prescribed colchicine for possible gout.  No family history of gout.  Her toe pain has slowly improved.  She was not sure if there was any significant redness or warmth.  Other issue is that she has had some other general arthralgias recently.  She has concern regarding autoimmune type disorders.  She has not noted any recent rash.  She does have history of reported B12 deficiency and for some time took injections.  She does not eat a lot of red meat but is not a strict vegetarian.  She wonders if her B12 may be low.  She has had recent increased fatigue issues.  Also requesting thyroid check.  Appetite and weight are stable.   ROS: See pertinent positives and negatives per HPI.  Past Medical History:  Diagnosis Date  . Anemia   . Asthma with hay fever    rarely uses inhaler  . B12 deficiency   . C. difficile diarrhea    during pregnancy, resolved  .  Hemorrhoids   . IBS (irritable bowel syndrome)   . IC (interstitial cystitis)    controlled  . Lipoma   . Seasonal allergies     Past Surgical History:  Procedure Laterality Date  . CESAREAN SECTION     x 2  . DILATION AND CURETTAGE OF UTERUS    . DILITATION & CURRETTAGE/HYSTROSCOPY WITH NOVASURE ABLATION N/A 12/23/2012   Procedure: DILATATION & CURETTAGE/HYSTEROSCOPY WITH NOVASURE ABLATION ;  Surgeon: [name], MD;  Location: [facility];  Service: Gynecology;  Laterality: N/A;  . endometrial polyp removal    . TUBAL LIGATION      Family History  Problem Relation Age of Onset  . Crohn's disease Brother   . Cancer Father        bladder  . Irritable bowel syndrome Brother   . Irregular heart beat Cousin        resolved  . Supraventricular tachycardia Cousin        s/p ablation  . Thyroid disease Mother   . Breast cancer Maternal Aunt   . Colon cancer Neg Hx     SOCIAL HX: non-smoker   Current Outpatient Medications:  .  albuterol (PROVENTIL HFA;VENTOLIN HFA) 108 (90 Base) MCG/ACT inhaler, Inhale 2 puffs into the lungs every 4 (four) hours as needed for wheezing or shortness of breath., Disp: 6.7 g, Rfl: 1 .  benzonatate (TESSALON) 100 MG capsule, Take 1 capsule (100 mg total) by mouth 3 (three) times daily  as needed for cough., Disp: 30 capsule, Rfl: 0 .  Cyanocobalamin (VITAMIN B-12 PO), Take by mouth every other day., Disp: , Rfl:  .  Probiotic Product (ALIGN PO), Take 1 capsule by mouth daily as needed (uses two weeks every month). For ibs symptoms, Disp: , Rfl:   EXAM:  VITALS per patient if applicable:  GENERAL: alert, oriented, appears well and in no acute distress  HEENT: atraumatic, conjunttiva clear, no obvious abnormalities on inspection of external nose and ears  NECK: normal movements of the head and neck  LUNGS: on inspection no signs of respiratory distress, breathing rate appears normal, no obvious gross SOB, gasping or wheezing  CV: no  obvious cyanosis  MS: moves all visible extremities without noticeable abnormality  PSYCH/NEURO: pleasant and cooperative, no obvious depression or anxiety, speech and thought processing grossly intact  ASSESSMENT AND PLAN:  Discussed the following assessment and plan:  #1 recent right great toe pain.  Not clear if this was gout versus other source of acute pain  -Recommend office follow-up for any recurrent swelling or pain  #2 generalized arthralgias.  -Patient return for further labs including C-reactive protein, uric acid, CBC, CCP antibody, rheumatoid factor, ANA  #3 reported history of B12 deficiency.  She is reporting increased fatigue symptoms  -Check TSH and B12 with future labs     I discussed the assessment and treatment plan with the patient. The patient was provided an opportunity to ask questions and all were answered. The patient agreed with the plan and demonstrated an understanding of the instructions.   The patient was advised to call back or seek an in-person evaluation if the symptoms worsen or if the condition fails to improve as anticipated.   Carolann Littler, MD

## 2018-12-30 ENCOUNTER — Other Ambulatory Visit: Payer: Self-pay

## 2018-12-30 ENCOUNTER — Ambulatory Visit: Payer: Managed Care, Other (non HMO) | Admitting: Gastroenterology

## 2018-12-30 ENCOUNTER — Other Ambulatory Visit (INDEPENDENT_AMBULATORY_CARE_PROVIDER_SITE_OTHER): Payer: Managed Care, Other (non HMO)

## 2018-12-30 ENCOUNTER — Encounter: Payer: Self-pay | Admitting: Gastroenterology

## 2018-12-30 VITALS — BP 90/68 | HR 81 | Temp 98.8°F | Ht 64.0 in | Wt 142.0 lb

## 2018-12-30 DIAGNOSIS — M2550 Pain in unspecified joint: Secondary | ICD-10-CM | POA: Diagnosis not present

## 2018-12-30 DIAGNOSIS — R5383 Other fatigue: Secondary | ICD-10-CM

## 2018-12-30 DIAGNOSIS — K588 Other irritable bowel syndrome: Secondary | ICD-10-CM

## 2018-12-30 DIAGNOSIS — L290 Pruritus ani: Secondary | ICD-10-CM | POA: Diagnosis not present

## 2018-12-30 DIAGNOSIS — R140 Abdominal distension (gaseous): Secondary | ICD-10-CM

## 2018-12-30 DIAGNOSIS — Z8639 Personal history of other endocrine, nutritional and metabolic disease: Secondary | ICD-10-CM | POA: Diagnosis not present

## 2018-12-30 DIAGNOSIS — B356 Tinea cruris: Secondary | ICD-10-CM | POA: Diagnosis not present

## 2018-12-30 LAB — CBC WITH DIFFERENTIAL/PLATELET
Basophils Absolute: 0 10*3/uL (ref 0.0–0.1)
Basophils Relative: 0.5 % (ref 0.0–3.0)
Eosinophils Absolute: 0.1 10*3/uL (ref 0.0–0.7)
Eosinophils Relative: 2.3 % (ref 0.0–5.0)
HCT: 43.5 % (ref 36.0–46.0)
Hemoglobin: 14.9 g/dL (ref 12.0–15.0)
Lymphocytes Relative: 32.1 % (ref 12.0–46.0)
Lymphs Abs: 1.8 10*3/uL (ref 0.7–4.0)
MCHC: 34.2 g/dL (ref 30.0–36.0)
MCV: 88.8 fl (ref 78.0–100.0)
Monocytes Absolute: 0.4 10*3/uL (ref 0.1–1.0)
Monocytes Relative: 7.9 % (ref 3.0–12.0)
Neutro Abs: 3.1 10*3/uL (ref 1.4–7.7)
Neutrophils Relative %: 57.2 % (ref 43.0–77.0)
Platelets: 184 10*3/uL (ref 150.0–400.0)
RBC: 4.9 Mil/uL (ref 3.87–5.11)
RDW: 12.6 % (ref 11.5–15.5)
WBC: 5.5 10*3/uL (ref 4.0–10.5)

## 2018-12-30 LAB — URIC ACID: Uric Acid, Serum: 3.1 mg/dL (ref 2.4–7.0)

## 2018-12-30 LAB — TSH: TSH: 1.18 u[IU]/mL (ref 0.35–4.50)

## 2018-12-30 LAB — VITAMIN B12: Vitamin B-12: 499 pg/mL (ref 211–911)

## 2018-12-30 LAB — C-REACTIVE PROTEIN: CRP: <1 mg/dL (ref 0.5–20.0)

## 2018-12-30 MED ORDER — CLOTRIMAZOLE 1 % EX OINT: TOPICAL_OINTMENT | CUTANEOUS | 0 refills | Status: AC

## 2018-12-30 NOTE — Progress Notes (Signed)
Kari Stephens    [id number]    20-Jan-1971  Primary Care Physician:Burchette, Alinda Sierras, MD  Referring Physician: Eulas Post, MD [place],  [place] [id number]   Chief complaint: Diarrhea, abdominal pain, perianal rash  HPI: 48 year old female with chronic irritable bowel syndrome, B12 deficiency here to reestablish care.  She was last seen in July 2017 Status post hemorrhoidal band ligation She developed an episode of diarrhea associated with abdominal pain, thinks had food poisoning.  She changed her diet and restricted it to vegetable base and her symptoms have improved.  She is currently having formed bowel movements daily. TTG Ig A Ab Negative for celiac disease IBD serology negative 2012  Flexible sigmoidoscopy by Dr. Paulita Fujita 06/25/2016: Showed normal rectum, sigmoid colon and distal descending colon.  Colonoscopy December 15, 2009 by Dr. Olevia Perches normal terminal ileum and colon, random biopsies normal benign mucosa  Outpatient Encounter Medications as of 12/30/2018  Medication Sig  . albuterol (PROVENTIL HFA;VENTOLIN HFA) 108 (90 Base) MCG/ACT inhaler Inhale 2 puffs into the lungs every 4 (four) hours as needed for wheezing or shortness of breath.  . Probiotic Product (ALIGN PO) Take 1 capsule by mouth daily as needed (uses two weeks every month). For ibs symptoms  . [DISCONTINUED] benzonatate (TESSALON) 100 MG capsule Take 1 capsule (100 mg total) by mouth 3 (three) times daily as needed for cough.  . [DISCONTINUED] Cyanocobalamin (VITAMIN B-12 PO) Take by mouth every other day.   No facility-administered encounter medications on file as of 12/30/2018.     Allergies as of 12/30/2018 - Review Complete 12/30/2018  Allergen Reaction Noted  . Sulfa antibiotics Nausea And Vomiting 10/12/2011    Past Medical History:  Diagnosis Date  . Anemia   . Asthma with hay fever    rarely uses inhaler  . B12 deficiency   . C. difficile diarrhea    during pregnancy, resolved  . Hemorrhoids   . IBS (irritable bowel syndrome)   . IC (interstitial cystitis)    controlled  . Lipoma   . Seasonal allergies     Past Surgical History:  Procedure Laterality Date  . CESAREAN SECTION     x 2  . DILATION AND CURETTAGE OF UTERUS    . DILITATION & CURRETTAGE/HYSTROSCOPY WITH NOVASURE ABLATION N/A 12/23/2012   Procedure: DILATATION & CURETTAGE/HYSTEROSCOPY WITH NOVASURE ABLATION ;  Surgeon: [name], MD;  Location: [facility];  Service: Gynecology;  Laterality: N/A;  . endometrial polyp removal    . TUBAL LIGATION      Family History  Problem Relation Age of Onset  . Crohn's disease Brother   . Cancer Father        bladder  . Irritable bowel syndrome Brother   . Irregular heart beat Cousin        resolved  . Supraventricular tachycardia Cousin        s/p ablation  . Thyroid disease Mother   . Breast cancer Maternal Aunt   . Colon cancer Neg Hx     Social History   Socioeconomic History  . Marital status: Married    Spouse name: Not on file  . Number of children: Not on file  . Years of education: Not on file  . Highest education level: Not on file  Occupational History  . Not on file  Social Needs  . Financial resource strain: Not on file  . Food insecurity  Worry: Not on file    Inability: Not on file  . Transportation needs    Medical: Not on file    Non-medical: Not on file  Tobacco Use  . Smoking status: Never Smoker  . Smokeless tobacco: Never Used  Substance and Sexual Activity  . Alcohol use: Yes    Alcohol/week: 7.0 standard drinks    Types: 7 Glasses of wine per week    Comment: 1 glass of wine most days  . Drug use: No  . Sexual activity: Yes    Birth control/protection: Surgical  Lifestyle  . Physical activity    Days per week: Not on file    Minutes per session: Not on file  . Stress: Not on file  Relationships  . Social Herbalist on phone: Not on file    Gets together:  Not on file    Attends religious service: Not on file    Active member of club or organization: Not on file    Attends meetings of clubs or organizations: Not on file    Relationship status: Not on file  . Intimate partner violence    Fear of current or ex partner: Not on file    Emotionally abused: Not on file    Physically abused: Not on file    Forced sexual activity: Not on file  Other Topics Concern  . Not on file  Social History Narrative   Lives in [place].     Proficient Engineering geologist.   Lives with spouse and 2 children (ages 25 and 52)      Review of systems: Review of Systems  Constitutional: Negative for fever and chills.  HENT: Negative.   Eyes: Negative for blurred vision.  Respiratory: Negative for cough, shortness of breath and wheezing.   Cardiovascular: Negative for chest pain and palpitations.  Gastrointestinal: as per HPI Genitourinary: Negative for dysuria, urgency, frequency and hematuria.  Musculoskeletal: Negative for myalgias, back pain.  Positive for right foot joint pain ?  Gout.  Skin: Positive for itching and rash.  Neurological: Negative for dizziness, tremors, focal weakness, seizures and loss of consciousness.  Endo/Heme/Allergies: Negative psychiatric/Behavioral: Negative for depression, suicidal ideas and hallucinations.  All other systems reviewed and are negative.   Physical Exam: Vitals:   12/30/18 1002  BP: 90/68  Pulse: 81  Temp: 98.8 F (37.1 C)   Body mass index is 24.37 kg/m. Gen:      No acute distress HEENT:  EOMI, sclera anicteric Neck:     No masses; no thyromegaly Lungs:    Clear to auscultation bilaterally; normal respiratory effort CV:         Regular rate and rhythm; no murmurs Abd:      + bowel sounds; soft, non-tender; no palpable masses, no distension Ext:    No edema; adequate peripheral perfusion Skin:      Warm and dry; no rash Neuro: alert and oriented x 3 Psych: normal mood and affect Rectal  exam: Normal anal sphincter tone, no anal fissure, perianal rash consistent with tinea cruris Data Reviewed:  Reviewed labs, radiology imaging, old records and pertinent past GI work up   Assessment and Plan/Recommendations:  48 year old female with chronic vitamin B12 deficiency, bloating, perianal rash associated with itching and irritable bowel syndrome  Diarrhea improved with dietary changes Perianal rash consistent with tinea cruris Advised patient to avoid excessive moisture or wiping after bowel movement Apply clotrimazole 1% ointment twice daily for 2 weeks  Return as needed Due for colorectal cancer screening 2023  30 minutes was spent face-to-face with the patient. Greater than 50% of the time used for counseling as well as treatment plan and follow-up. She had multiple questions which were answered to her satisfaction  K. Denzil Magnuson , MD    CC: Eulas Post, MD

## 2018-12-30 NOTE — Patient Instructions (Addendum)
We have sent your prescription to your pharmacy  Keep perianal area dry and avoid moisture  Follow up as needed  I appreciate the  opportunity to care for you  Thank You   Harl Bowie , MD

## 2019-01-01 LAB — RHEUMATOID FACTOR: Rhuematoid fact SerPl-aCnc: <14 IU/mL (ref <14)

## 2019-01-01 LAB — ANTI-NUCLEAR AB-TITER (ANA TITER)
ANA TITER: 1:160 {titer} — ABNORMAL HIGH
ANA Titer 1: 1:80 {titer} — ABNORMAL HIGH

## 2019-01-01 LAB — ANA: Anti Nuclear Antibody (ANA): POSITIVE — AB

## 2019-01-01 LAB — CYCLIC CITRUL PEPTIDE ANTIBODY, IGG: Cyclic Citrullin Peptide Ab: <16 UNITS

## 2019-02-13 ENCOUNTER — Ambulatory Visit: Payer: Self-pay

## 2019-02-13 NOTE — Telephone Encounter (Signed)
Pt. Reports her husband has had a fever, was as high as 104. He was tested today for COVID 19. Reports she is just having allergy symptoms she has every year at this time. Wants to know from Dr. Elease Hashimoto if she should be tested as well. Please advise pt.

## 2019-02-16 NOTE — Telephone Encounter (Signed)
Probably would go ahead and get tested.

## 2019-02-16 NOTE — Telephone Encounter (Signed)
Left message on machine to call back, need to inform pt of where different testing sites are

## 2019-02-20 NOTE — Telephone Encounter (Signed)
Attempted to reach patient. No answer. Vm left to call back  

## 2019-05-14 DIAGNOSIS — R000 Tachycardia, unspecified: Secondary | ICD-10-CM | POA: Insufficient documentation

## 2019-05-14 DIAGNOSIS — J302 Other seasonal allergic rhinitis: Secondary | ICD-10-CM | POA: Insufficient documentation

## 2019-11-25 ENCOUNTER — Telehealth: Payer: Self-pay | Admitting: Family Medicine

## 2019-11-25 NOTE — Telephone Encounter (Signed)
error 

## 2019-11-27 ENCOUNTER — Other Ambulatory Visit: Payer: Self-pay

## 2019-11-27 ENCOUNTER — Telehealth (INDEPENDENT_AMBULATORY_CARE_PROVIDER_SITE_OTHER): Payer: 59 | Admitting: Family Medicine

## 2019-11-27 DIAGNOSIS — R509 Fever, unspecified: Secondary | ICD-10-CM

## 2019-11-27 NOTE — Progress Notes (Signed)
Patient ID: Kari Stephens, female   DOB: January 08, 1971, 49 y.o.   MRN: [id number]  This visit type was conducted due to national recommendations for restrictions regarding the COVID-19 pandemic in an effort to limit this patient's exposure and mitigate transmission in our community.   Virtual Visit via Telephone Note  I connected with Kari Stephens on 11/27/19 at  9:45 AM EDT by telephone and verified that I am speaking with the correct person using two identifiers.   I discussed the limitations, risks, security and privacy concerns of performing an evaluation and management service by telephone and the availability of in person appointments. I also discussed with the patient that there may be a patient responsible charge related to this service. The patient expressed understanding and agreed to proceed.  Location patient: home Location provider: work or home office Participants present for the call: patient, provider Patient did not have a visit in the prior 7 days to address this/these issue(s).   History of Present Illness: Kari Stephens developed fever and chills last Friday night.  They had some friends from [place] that were in visiting and one of the teenage children subsequently tested positive for Covid.  Since that time Kari Stephens had one of her children test positive.  Kari Stephens went to the [facility] last Saturday morning and was Covid negative.  Her temperatures range between 99 to 102 all week.  She had some myalgias.  Intermittent headache.  Nasal congestion.  No dyspnea.  No dysuria.  No skin rashes.  She did not have Covid vaccine.  No nausea or vomiting.  Overall feels fairly stable but still some fatigue.  She is staying quarantine   Observations/Objective: Patient sounds cheerful and well on the phone. I do not appreciate any SOB. Speech and thought processing are grossly intact. Patient reported vitals:  Assessment and Plan:  One week history of fever, nasal congestion, chills,  myalgia.  Suspect Covid.  Fortunately she has no significant dyspnea and is coping fairly well at this time  -She has plans already to go later today to get repeat Covid tested -Supportive measures with plenty of fluids and rest -Stay quarantined minimum of 10 days -If fever not resolving by early next week be in touch -She knows to go promptly to the ER if she has any increased dyspnea  Follow Up Instructions:  -As above   [id number] 5-10 [id number] 11-20 99443 21-30 I did not refer this patient for an OV in the next 24 hours for this/these issue(s).  I discussed the assessment and treatment plan with the patient. The patient was provided an opportunity to ask questions and all were answered. The patient agreed with the plan and demonstrated an understanding of the instructions.   The patient was advised to call back or seek an in-person evaluation if the symptoms worsen or if the condition fails to improve as anticipated.  I provided 13 minutes of non-face-to-face time during this encounter.   Kari Littler, MD

## 2019-11-30 ENCOUNTER — Other Ambulatory Visit: Payer: Self-pay

## 2019-11-30 ENCOUNTER — Telehealth: Payer: Self-pay | Admitting: Family Medicine

## 2019-11-30 ENCOUNTER — Emergency Department (HOSPITAL_COMMUNITY)
Admission: EM | Admit: 2019-11-30 | Discharge: 2019-11-30 | Disposition: A | Discharge status: Home / Self Care | Payer: 59 | Attending: Emergency Medicine | Admitting: Emergency Medicine

## 2019-11-30 ENCOUNTER — Encounter: Payer: Self-pay | Admitting: Family Medicine

## 2019-11-30 ENCOUNTER — Encounter (HOSPITAL_COMMUNITY): Payer: Self-pay | Admitting: Emergency Medicine

## 2019-11-30 DIAGNOSIS — U071 COVID-19: Secondary | ICD-10-CM | POA: Insufficient documentation

## 2019-11-30 DIAGNOSIS — Z5321 Procedure and treatment not carried out due to patient leaving prior to being seen by health care provider: Secondary | ICD-10-CM | POA: Insufficient documentation

## 2019-11-30 DIAGNOSIS — R05 Cough: Secondary | ICD-10-CM | POA: Diagnosis present

## 2019-11-30 DIAGNOSIS — R509 Fever, unspecified: Secondary | ICD-10-CM | POA: Diagnosis not present

## 2019-11-30 LAB — URINALYSIS, ROUTINE W REFLEX MICROSCOPIC
Bilirubin Urine: NEGATIVE
Glucose, UA: NEGATIVE mg/dL
Hgb urine dipstick: NEGATIVE
Ketones, ur: NEGATIVE mg/dL
Leukocytes,Ua: NEGATIVE
Nitrite: NEGATIVE
Protein, ur: NEGATIVE mg/dL
Specific Gravity, Urine: 1.025 (ref 1.005–1.030)
pH: 5 (ref 5.0–8.0)

## 2019-11-30 LAB — I-STAT BETA HCG BLOOD, ED (MC, WL, AP ONLY): I-stat hCG, quantitative: <5 m[IU]/mL (ref <5)

## 2019-11-30 LAB — CBC WITH DIFFERENTIAL/PLATELET
Abs Immature Granulocytes: 0.01 10*3/uL (ref 0.00–0.07)
Basophils Absolute: 0 10*3/uL (ref 0.0–0.1)
Basophils Relative: 0 %
Eosinophils Absolute: 0 10*3/uL (ref 0.0–0.5)
Eosinophils Relative: 0 %
HCT: 44.7 % (ref 36.0–46.0)
Hemoglobin: 15.3 g/dL — ABNORMAL HIGH (ref 12.0–15.0)
Immature Granulocytes: 0 %
Lymphocytes Relative: 31 %
Lymphs Abs: 1.2 10*3/uL (ref 0.7–4.0)
MCH: 30.4 pg (ref 26.0–34.0)
MCHC: 34.2 g/dL (ref 30.0–36.0)
MCV: 88.7 fL (ref 80.0–100.0)
Monocytes Absolute: 0.4 10*3/uL (ref 0.1–1.0)
Monocytes Relative: 11 %
Neutro Abs: 2.1 10*3/uL (ref 1.7–7.7)
Neutrophils Relative %: 58 %
Platelets: 156 10*3/uL (ref 150–400)
RBC: 5.04 MIL/uL (ref 3.87–5.11)
RDW: 11.2 % — ABNORMAL LOW (ref 11.5–15.5)
WBC: 3.7 10*3/uL — ABNORMAL LOW (ref 4.0–10.5)
nRBC: 0 % (ref 0.0–0.2)

## 2019-11-30 LAB — COMPREHENSIVE METABOLIC PANEL
ALT: 16 U/L (ref 0–44)
AST: 22 U/L (ref 15–41)
Albumin: 4.1 g/dL (ref 3.5–5.0)
Alkaline Phosphatase: 63 U/L (ref 38–126)
Anion gap: 10 (ref 5–15)
BUN: 6 mg/dL (ref 6–20)
CO2: 24 mmol/L (ref 22–32)
Calcium: 8.9 mg/dL (ref 8.9–10.3)
Chloride: 102 mmol/L (ref 98–111)
Creatinine, Ser: 0.9 mg/dL (ref 0.44–1.00)
GFR calc Af Amer: >60 mL/min (ref >60)
GFR calc non Af Amer: >60 mL/min (ref >60)
Glucose, Bld: 113 mg/dL — ABNORMAL HIGH (ref 70–99)
Potassium: 3.8 mmol/L (ref 3.5–5.1)
Sodium: 136 mmol/L (ref 135–145)
Total Bilirubin: 0.9 mg/dL (ref 0.3–1.2)
Total Protein: 6.2 g/dL — ABNORMAL LOW (ref 6.5–8.1)

## 2019-11-30 LAB — LACTIC ACID, PLASMA: Lactic Acid, Venous: 1.4 mmol/L (ref 0.5–1.9)

## 2019-11-30 MED ORDER — SODIUM CHLORIDE 0.9% FLUSH: 3.0000 mL | Freq: Once | INTRAVENOUS | Status: DC

## 2019-11-30 MED ORDER — ACETAMINOPHEN 500 MG PO TABS
1000.0000 mg | ORAL_TABLET | Freq: Once | ORAL | Status: AC
  Administered 2019-11-30: 1000 mg via ORAL
  Filled 2019-11-30: qty 2

## 2019-11-30 NOTE — Telephone Encounter (Signed)
Pt states her fever has gone done to 100.3 and first meal . Pt states she has felt better some today and will decide wether she will go to ER

## 2019-11-30 NOTE — ED Notes (Signed)
Patient states "I cant wait here all day I have kids." patient handed me urine for processing and then lwbs

## 2019-11-30 NOTE — ED Triage Notes (Signed)
Pt's tested Covid+ 3 days ago, began having high fevers since 7/23. States her cough has gotten worse, and more productive. RA sats 100%

## 2019-11-30 NOTE — Telephone Encounter (Signed)
If she is feeling even worse than she did last week with persistent fever would suggest she consider ER follow-up.  Fever can last for over a week but I do think it would deserve further evaluation as she is still having fevers over 101 at this point and no improvement to make sure she does not have another secondary infection

## 2019-11-30 NOTE — Telephone Encounter (Signed)
Verify if she got Covid re-tested.  If not, would suggest that she do so to further clarify.

## 2019-11-30 NOTE — Telephone Encounter (Signed)
Pt called to say she is not feeling better.  Was told by Dr. Elease Hashimoto to call him if she doesn't improve. Has a temperature of 101.8 when she checked it this morning. Still stuffy very fatigued  Wants to know if there is something else she can do for her syptoms  Please call pt at [phone]  Please advise.

## 2019-11-30 NOTE — Telephone Encounter (Signed)
Please advise 

## 2019-11-30 NOTE — Telephone Encounter (Signed)
Pt did get tested for covid and it came back positive please advise

## 2019-12-01 ENCOUNTER — Telehealth: Payer: Self-pay | Admitting: Family Medicine

## 2019-12-01 NOTE — Telephone Encounter (Signed)
Pt  Called to give feedback on experience at the ER and want a call back!  Pt may be interested in [place] [facility] [place] [facility] [place]  Pt. [phone]  Please advise

## 2019-12-01 NOTE — Telephone Encounter (Signed)
fyi

## 2019-12-01 NOTE — Telephone Encounter (Signed)
Can you send her the number to the [place] clinic?

## 2019-12-01 NOTE — Telephone Encounter (Signed)
Sent through mychart

## 2019-12-04 ENCOUNTER — Ambulatory Visit (HOSPITAL_COMMUNITY)
Admission: RE | Admit: 2019-12-04 | Discharge: 2019-12-04 | Disposition: A | Discharge status: Home / Self Care | Payer: 59 | Source: Ambulatory Visit | Attending: Pulmonary Disease | Admitting: Pulmonary Disease

## 2019-12-04 ENCOUNTER — Other Ambulatory Visit: Payer: Self-pay | Admitting: Nurse Practitioner

## 2019-12-04 DIAGNOSIS — U071 COVID-19: Secondary | ICD-10-CM

## 2019-12-04 DIAGNOSIS — J4520 Mild intermittent asthma, uncomplicated: Secondary | ICD-10-CM

## 2019-12-04 MED ORDER — SODIUM CHLORIDE 0.9 % IV SOLN
1200.0000 mg | Freq: Once | INTRAVENOUS | Status: DC
  Filled 2019-12-04: qty 10

## 2019-12-04 NOTE — Progress Notes (Signed)
I connected by phone with Donnie Aho on 12/04/2019 at 9:05 AM to discuss the potential use of a new treatment for mild to moderate COVID-19 viral infection in non-hospitalized patients.  This patient is a 49 y.o. female that meets the FDA criteria for Emergency Use Authorization of COVID monoclonal antibody casirivimab/imdevimab.  Has a (+) direct SARS-CoV-2 viral test result  Has mild or moderate COVID-19   Is NOT hospitalized due to COVID-19  Is within 10 days of symptom onset  Has at least one of the high risk factor(s) for progression to severe COVID-19 and/or hospitalization as defined in EUA.  Specific high risk criteria : Chronic Lung Disease   I have spoken and communicated the following to the patient or parent/caregiver regarding COVID monoclonal antibody treatment:  1. FDA has authorized the emergency use for the treatment of mild to moderate COVID-19 in adults and pediatric patients with positive results of direct SARS-CoV-2 viral testing who are 67 years of age and older weighing at least 40 kg, and who are at high risk for progressing to severe COVID-19 and/or hospitalization.  2. The significant known and potential risks and benefits of COVID monoclonal antibody, and the extent to which such potential risks and benefits are unknown.  3. Information on available alternative treatments and the risks and benefits of those alternatives, including clinical trials.  4. Patients treated with COVID monoclonal antibody should continue to self-isolate and use infection control measures (e.g., wear mask, isolate, social distance, avoid sharing personal items, clean and disinfect "high touch" surfaces, and frequent handwashing) according to CDC guidelines.   5. The patient or parent/caregiver has the option to accept or refuse COVID monoclonal antibody treatment.  After reviewing this information with the patient, The patient agreed to proceed with receiving casirivimab\imdevimab  infusion and will be provided a copy of the Fact sheet prior to receiving the infusion. Fenton Foy 12/04/2019 9:05 AM

## 2019-12-08 ENCOUNTER — Telehealth (INDEPENDENT_AMBULATORY_CARE_PROVIDER_SITE_OTHER): Payer: 59 | Admitting: Family Medicine

## 2019-12-08 DIAGNOSIS — R42 Dizziness and giddiness: Secondary | ICD-10-CM | POA: Diagnosis not present

## 2019-12-08 MED ORDER — MECLIZINE HCL 25 MG PO TABS: 25.0000 mg | ORAL_TABLET | Freq: Three times a day (TID) | ORAL | 0 refills | Status: AC | PRN

## 2019-12-08 NOTE — Progress Notes (Signed)
Patient ID: Kari Stephens, female   DOB: 01-Jun-1970, 49 y.o.   MRN: [id number]   This visit type was conducted due to national recommendations for restrictions regarding the COVID-19 pandemic in an effort to limit this patient's exposure and mitigate transmission in our community.   Virtual Visit via Video Note  I connected with Kari Stephens on 12/08/19 at  2:45 PM EDT by a video enabled telemedicine application and verified that I am speaking with the correct person using two identifiers.  Location patient: home Location provider:work or home office Persons participating in the virtual visit: patient, provider  I discussed the limitations of evaluation and management by telemedicine and the availability of in person appointments. The patient expressed understanding and agreed to proceed.   HPI: Kari Stephens was diagnosed about 2 weeks ago with Covid.  She was feeling better and felt like she was in recovery until yesterday morning when she woke up around 8:30 AM with vertigo symptoms.  She had some associated nausea.  She went to urgent care and was prescribed Zofran which has helped some with her nausea.  Her vertigo is still present today but not quite as intense.  Symptoms seem to be worse when turning head to the right side.  Denies any prior history of vertigo.  Denies any hearing change, fever, slurred speech, ataxia, focal weakness, or any severe headache   ROS: See pertinent positives and negatives per HPI.  Past Medical History:  Diagnosis Date  . Anemia   . Asthma with hay fever    rarely uses inhaler  . B12 deficiency   . C. difficile diarrhea    during pregnancy, resolved  . Hemorrhoids   . IBS (irritable bowel syndrome)   . IC (interstitial cystitis)    controlled  . Lipoma   . Seasonal allergies     Past Surgical History:  Procedure Laterality Date  . CESAREAN SECTION     x 2  . DILATION AND CURETTAGE OF UTERUS    . DILITATION & CURRETTAGE/HYSTROSCOPY WITH  NOVASURE ABLATION N/A 12/23/2012   Procedure: DILATATION & CURETTAGE/HYSTEROSCOPY WITH NOVASURE ABLATION ;  Surgeon: [name], MD;  Location: [facility];  Service: Gynecology;  Laterality: N/A;  . endometrial polyp removal    . TUBAL LIGATION      Family History  Problem Relation Age of Onset  . Crohn's disease Brother   . Cancer Father        bladder  . Irritable bowel syndrome Brother   . Irregular heart beat Cousin        resolved  . Supraventricular tachycardia Cousin        s/p ablation  . Thyroid disease Mother   . Breast cancer Maternal Aunt   . Colon cancer Neg Hx     SOCIAL HX: Non-smoker   Current Outpatient Medications:  .  albuterol (PROVENTIL HFA;VENTOLIN HFA) 108 (90 Base) MCG/ACT inhaler, Inhale 2 puffs into the lungs every 4 (four) hours as needed for wheezing or shortness of breath., Disp: 6.7 g, Rfl: 1 .  Clotrimazole 1 % OINT, Apply pea size amount twice daily for 2 weeks, Disp: 56 g, Rfl: 0 .  meclizine (ANTIVERT) 25 MG tablet, Take 1 tablet (25 mg total) by mouth 3 (three) times daily as needed for dizziness., Disp: 30 tablet, Rfl: 0 .  Probiotic Product (ALIGN PO), Take 1 capsule by mouth daily as needed (uses two weeks every month). For ibs symptoms, Disp: , Rfl:  No current facility-administered  medications for this visit.  Facility-Administered Medications Ordered in Other Visits:  .  casirivimab-imdevimab (REGEN-COV) 1,200 mg in sodium chloride 0.9 % 110 mL IVPB, 1,200 mg, Intravenous, Once, Green, Terri L, RPH  EXAM:  VITALS per patient if applicable:  GENERAL: alert, oriented, appears well and in no acute distress  HEENT: atraumatic, conjunttiva clear, no obvious abnormalities on inspection of external nose and ears  NECK: normal movements of the head and neck  LUNGS: on inspection no signs of respiratory distress, breathing rate appears normal, no obvious gross SOB, gasping or wheezing  CV: no obvious cyanosis  MS: moves all visible  extremities without noticeable abnormality  PSYCH/NEURO: pleasant and cooperative, no obvious depression or anxiety, speech and thought processing grossly intact  ASSESSMENT AND PLAN:  Discussed the following assessment and plan:  Recent onset of vertigo yesterday.  By history, this is triggered with turning head to the right and suggest probable benign peripheral positional vertigo.  No red flags for more worrisome vertigo at this time  -We recommend she consider Epley maneuvers and she will look these up and we did talk through how to do these -Consider short-term use of meclizine 25 mg every 6 hours as needed for nausea.  She is aware this may be sedating     I discussed the assessment and treatment plan with the patient. The patient was provided an opportunity to ask questions and all were answered. The patient agreed with the plan and demonstrated an understanding of the instructions.   The patient was advised to call back or seek an in-person evaluation if the symptoms worsen or if the condition fails to improve as anticipated.     Carolann Littler, MD

## 2019-12-09 ENCOUNTER — Ambulatory Visit: Payer: 59 | Admitting: Family Medicine

## 2019-12-09 ENCOUNTER — Encounter: Payer: Self-pay | Admitting: Family Medicine

## 2019-12-09 ENCOUNTER — Ambulatory Visit: Payer: 59

## 2019-12-09 ENCOUNTER — Other Ambulatory Visit: Payer: Self-pay

## 2019-12-09 VITALS — BP 90/60 | HR 95 | Temp 98.8°F

## 2019-12-09 DIAGNOSIS — R110 Nausea: Secondary | ICD-10-CM | POA: Diagnosis not present

## 2019-12-09 DIAGNOSIS — R42 Dizziness and giddiness: Secondary | ICD-10-CM

## 2019-12-09 NOTE — Progress Notes (Signed)
Established Patient Office Visit  Subjective:  Patient ID: Kari Stephens, female    DOB: Mar 14, 1971  Age: 49 y.o. MRN: [id number]  CC:  Chief Complaint  Patient presents with  . Dizziness    HPI Kari Stephens presents for evaluation of vertigo. Refer to recent virtual note for details. She did have recent Covid and is past 14 days from onset and has been afebrile for several days now. She felt like she was fully recovering except for some fatigue issues until Monday morning when she woke up with some vertigo which is worse with turning head to the right. We had instructed her to try some Epley maneuvers and apparently she did some yesterday. She has been taking Zofran for nausea. Nausea is some improved. She is keeping fluids down well.  She has had some altered taste since the Covid but no other active symptoms. No significant cough. No fever. No nasal congestion.  She denies any headache. No hearing loss. No swallowing difficulties. No visual changes. No focal weakness. No ataxia. She had recent lab work in the ER and those labs were reviewed. Her chemistries and CBC were mostly unremarkable  Past Medical History:  Diagnosis Date  . Anemia   . Asthma with hay fever    rarely uses inhaler  . B12 deficiency   . C. difficile diarrhea    during pregnancy, resolved  . Hemorrhoids   . IBS (irritable bowel syndrome)   . IC (interstitial cystitis)    controlled  . Lipoma   . Seasonal allergies     Past Surgical History:  Procedure Laterality Date  . CESAREAN SECTION     x 2  . DILATION AND CURETTAGE OF UTERUS    . DILITATION & CURRETTAGE/HYSTROSCOPY WITH NOVASURE ABLATION N/A 12/23/2012   Procedure: DILATATION & CURETTAGE/HYSTEROSCOPY WITH NOVASURE ABLATION ;  Surgeon: [name], MD;  Location: [facility];  Service: Gynecology;  Laterality: N/A;  . endometrial polyp removal    . TUBAL LIGATION      Family History  Problem Relation Age of Onset  . Crohn's  disease Brother   . Cancer Father        bladder  . Irritable bowel syndrome Brother   . Irregular heart beat Cousin        resolved  . Supraventricular tachycardia Cousin        s/p ablation  . Thyroid disease Mother   . Breast cancer Maternal Aunt   . Colon cancer Neg Hx     Social History   Socioeconomic History  . Marital status: Married    Spouse name: Not on file  . Number of children: Not on file  . Years of education: Not on file  . Highest education level: Not on file  Occupational History  . Not on file  Tobacco Use  . Smoking status: Never Smoker  . Smokeless tobacco: Never Used  Vaping Use  . Vaping Use: Never used  Substance and Sexual Activity  . Alcohol use: Yes    Alcohol/week: 7.0 standard drinks    Types: 7 Glasses of wine per week    Comment: 1 glass of wine most days  . Drug use: No  . Sexual activity: Yes    Birth control/protection: Surgical  Other Topics Concern  . Not on file  Social History Narrative   Lives in [place].     Proficient Engineering geologist.   Lives with spouse and 2 children (ages 62 and  3)   Social Determinants of Health   Financial Resource Strain:   . Difficulty of Paying Living Expenses:   Food Insecurity:   . Worried About Charity fundraiser in the Last Year:   . Arboriculturist in the Last Year:   Transportation Needs:   . Film/video editor (Medical):   Marland Kitchen Lack of Transportation (Non-Medical):   Physical Activity:   . Days of Exercise per Week:   . Minutes of Exercise per Session:   Stress:   . Feeling of Stress :   Social Connections:   . Frequency of Communication with Friends and Family:   . Frequency of Social Gatherings with Friends and Family:   . Attends Religious Services:   . Active Member of Clubs or Organizations:   . Attends Archivist Meetings:   Marland Kitchen Marital Status:   Intimate Partner Violence:   . Fear of Current or Ex-Partner:   . Emotionally Abused:   Marland Kitchen Physically  Abused:   . Sexually Abused:     Outpatient Medications Prior to Visit  Medication Sig Dispense Refill  . albuterol (PROVENTIL HFA;VENTOLIN HFA) 108 (90 Base) MCG/ACT inhaler Inhale 2 puffs into the lungs every 4 (four) hours as needed for wheezing or shortness of breath. 6.7 g 1  . Clotrimazole 1 % OINT Apply pea size amount twice daily for 2 weeks 56 g 0  . meclizine (ANTIVERT) 25 MG tablet Take 1 tablet (25 mg total) by mouth 3 (three) times daily as needed for dizziness. 30 tablet 0  . Probiotic Product (ALIGN PO) Take 1 capsule by mouth daily as needed (uses two weeks every month). For ibs symptoms     Facility-Administered Medications Prior to Visit  Medication Dose Route Frequency Provider Last Rate Last Admin  . casirivimab-imdevimab (REGEN-COV) 1,200 mg in sodium chloride 0.9 % 110 mL IVPB  1,200 mg Intravenous Once Green, Terri L, RPH        Allergies  Allergen Reactions  . Sulfa Antibiotics Nausea And Vomiting    ROS Review of Systems  Constitutional: Positive for fatigue. Negative for chills and fever.  HENT: Negative for hearing loss.   Respiratory: Negative for shortness of breath.   Cardiovascular: Negative for chest pain, palpitations and leg swelling.  Gastrointestinal: Positive for nausea. Negative for abdominal pain, diarrhea and vomiting.  Genitourinary: Negative for dysuria.  Neurological: Negative for headaches.  Hematological: Negative for adenopathy.  Psychiatric/Behavioral: Negative for confusion.      Objective:    Physical Exam Vitals reviewed.  Constitutional:      Appearance: Normal appearance.  HENT:     Right Ear: Tympanic membrane and ear canal normal.     Left Ear: Tympanic membrane and ear canal normal.  Cardiovascular:     Rate and Rhythm: Normal rate and regular rhythm.  Pulmonary:     Effort: Pulmonary effort is normal.     Breath sounds: Normal breath sounds.  Musculoskeletal:     Cervical back: Neck supple.     Right lower leg:  No edema.     Left lower leg: No edema.  Lymphadenopathy:     Cervical: No cervical adenopathy.  Neurological:     General: No focal deficit present.     Mental Status: She is alert and oriented to person, place, and time.     Cranial Nerves: No cranial nerve deficit.     Motor: No weakness.     Coordination: Coordination normal.  Gait: Gait normal.     Comments: Finger-to-nose testing is normal. Gait normal. No focal weakness. We could not reproduce her vertigo with going from seated to supine with head turned to the right or left.     BP 90/60   Pulse 95   Temp 98.8 F (37.1 C) (Oral)   SpO2 98%  Wt Readings from Last 3 Encounters:  11/30/19 141 lb 15.6 oz (64.4 kg)  12/30/18 142 lb (64.4 kg)  05/26/18 148 lb (67.1 kg)     Health Maintenance Due  Topic Date Due  . Hepatitis C Screening  Never done  . HIV Screening  Never done  . TETANUS/TDAP  Never done  . PAP SMEAR-Modifier  Never done  . INFLUENZA VACCINE  11/29/2019    There are no preventive care reminders to display for this patient.  Lab Results  Component Value Date   TSH 1.18 12/30/2018   Lab Results  Component Value Date   WBC 3.7 (L) 11/30/2019   HGB 15.3 (H) 11/30/2019   HCT 44.7 11/30/2019   MCV 88.7 11/30/2019   PLT 156 11/30/2019   Lab Results  Component Value Date   NA 136 11/30/2019   K 3.8 11/30/2019   CO2 24 11/30/2019   GLUCOSE 113 (H) 11/30/2019   BUN 6 11/30/2019   CREATININE 0.90 11/30/2019   BILITOT 0.9 11/30/2019   ALKPHOS 63 11/30/2019   AST 22 11/30/2019   ALT 16 11/30/2019   PROT 6.2 (L) 11/30/2019   ALBUMIN 4.1 11/30/2019   CALCIUM 8.9 11/30/2019   ANIONGAP 10 11/30/2019   GFR 80.70 11/29/2014   No results found for: CHOL No results found for: HDL No results found for: LDLCALC No results found for: TRIG No results found for: CHOLHDL No results found for: HGBA1C    Assessment & Plan:   Recent onset of vertigo. Doubt related to recent Covid but cannot be  sure. This is listed as an infrequent post Covid symptom. No other active symptoms at this time. Suspect benign peripheral positional vertigo. Already improving.  -We reviewed red flags of things to watch for for more worrisome vertigo -Continue home Epley maneuvers with handout given -Follow-up promptly for any changes symptoms or if symptoms not resolving in 1 week  No orders of the defined types were placed in this encounter.   Follow-up: No follow-ups on file.    Carolann Littler, MD

## 2019-12-09 NOTE — Patient Instructions (Signed)
Benign Positional Vertigo Vertigo is the feeling that you or your surroundings are moving when they are not. Benign positional vertigo is the most common form of vertigo. This is usually a harmless condition (benign). This condition is positional. This means that symptoms are triggered by certain movements and positions. This condition can be dangerous if it occurs while you are doing something that could cause harm to you or others. This includes activities such as driving or operating machinery. What are the causes? In many cases, the cause of this condition is not known. It may be caused by a disturbance in an area of the inner ear that helps your brain to sense movement and balance. This disturbance can be caused by:  Viral infection (labyrinthitis).  Head injury.  Repetitive motion, such as jumping, dancing, or running. What increases the risk? You are more likely to develop this condition if:  You are a woman.  You are 50 years of age or older. What are the signs or symptoms? Symptoms of this condition usually happen when you move your head or your eyes in different directions. Symptoms may start suddenly, and usually last for less than a minute. They include:  Loss of balance and falling.  Feeling like you are spinning or moving.  Feeling like your surroundings are spinning or moving.  Nausea and vomiting.  Blurred vision.  Dizziness.  Involuntary eye movement (nystagmus). Symptoms can be mild and cause only minor problems, or they can be severe and interfere with daily life. Episodes of benign positional vertigo may return (recur) over time. Symptoms may improve over time. How is this diagnosed? This condition may be diagnosed based on:  Your medical history.  Physical exam of the head, neck, and ears.  Tests, such as: ? MRI. ? CT scan. ? Eye movement tests. Your health care provider may ask you to change positions quickly while he or she watches you for symptoms  of benign positional vertigo, such as nystagmus. Eye movement may be tested with a variety of exams that are designed to evaluate or stimulate vertigo. ? An electroencephalogram (EEG). This records electrical activity in your brain. ? Hearing tests. You may be referred to a health care provider who specializes in ear, nose, and throat (ENT) problems (otolaryngologist) or a provider who specializes in disorders of the nervous system (neurologist). How is this treated?  This condition may be treated in a session in which your health care provider moves your head in specific positions to adjust your inner ear back to normal. Treatment for this condition may take several sessions. Surgery may be needed in severe cases, but this is rare. In some cases, benign positional vertigo may resolve on its own in 2-4 weeks. Follow these instructions at home: Safety  Move slowly. Avoid sudden body or head movements or certain positions, as told by your health care provider.  Avoid driving until your health care provider says it is safe for you to do so.  Avoid operating heavy machinery until your health care provider says it is safe for you to do so.  Avoid doing any tasks that would be dangerous to you or others if vertigo occurs.  If you have trouble walking or keeping your balance, try using a cane for stability. If you feel dizzy or unstable, sit down right away.  Return to your normal activities as told by your health care provider. Ask your health care provider what activities are safe for you. General instructions  Take over-the-counter   and prescription medicines only as told by your health care provider.  Drink enough fluid to keep your urine pale yellow.  Keep all follow-up visits as told by your health care provider. This is important. Contact a health care provider if:  You have a fever.  Your condition gets worse or you develop new symptoms.  Your family or friends notice any  behavioral changes.  You have nausea or vomiting that gets worse.  You have numbness or a "pins and needles" sensation. Get help right away if you:  Have difficulty speaking or moving.  Are always dizzy.  Faint.  Develop severe headaches.  Have weakness in your legs or arms.  Have changes in your hearing or vision.  Develop a stiff neck.  Develop sensitivity to light. Summary  Vertigo is the feeling that you or your surroundings are moving when they are not. Benign positional vertigo is the most common form of vertigo.  The cause of this condition is not known. It may be caused by a disturbance in an area of the inner ear that helps your brain to sense movement and balance.  Symptoms include loss of balance and falling, feeling that you or your surroundings are moving, nausea and vomiting, and blurred vision.  This condition can be diagnosed based on symptoms, physical exam, and other tests, such as MRI, CT scan, eye movement tests, and hearing tests.  Follow safety instructions as told by your health care provider. You will also be told when to contact your health care provider in case of problems. This information is not intended to replace advice given to you by your health care provider. Make sure you discuss any questions you have with your health care provider. Document Revised: 09/25/2017 Document Reviewed: 09/25/2017 Elsevier Patient Education  2020 Elsevier Inc.  How to Perform the Epley Maneuver The Epley maneuver is an exercise that relieves symptoms of vertigo. Vertigo is the feeling that you or your surroundings are moving when they are not. When you feel vertigo, you may feel like the room is spinning and have trouble walking. Dizziness is a little different than vertigo. When you are dizzy, you may feel unsteady or light-headed. You can do this maneuver at home whenever you have symptoms of vertigo. You can do it up to 3 times a day until your symptoms go  away. Even though the Epley maneuver may relieve your vertigo for a few weeks, it is possible that your symptoms will return. This maneuver relieves vertigo, but it does not relieve dizziness. What are the risks? If it is done correctly, the Epley maneuver is considered safe. Sometimes it can lead to dizziness or nausea that goes away after a short time. If you develop other symptoms, such as changes in vision, weakness, or numbness, stop doing the maneuver and call your health care provider. How to perform the Epley maneuver 1. Sit on the edge of a bed or table with your back straight and your legs extended or hanging over the edge of the bed or table. 2. Turn your head halfway toward the affected ear or side. 3. Lie backward quickly with your head turned until you are lying flat on your back. You may want to position a pillow under your shoulders. 4. Hold this position for 30 seconds. You may experience an attack of vertigo. This is normal. 5. Turn your head to the opposite direction until your unaffected ear is facing the floor. 6. Hold this position for 30 seconds. You   may experience an attack of vertigo. This is normal. Hold this position until the vertigo stops. 7. Turn your whole body to the same side as your head. Hold for another 30 seconds. 8. Sit back up. You can repeat this exercise up to 3 times a day. Follow these instructions at home:  After doing the Epley maneuver, you can return to your normal activities.  Ask your health care provider if there is anything you should do at home to prevent vertigo. He or she may recommend that you: ? Keep your head raised (elevated) with two or more pillows while you sleep. ? Do not sleep on the side of your affected ear. ? Get up slowly from bed. ? Avoid sudden movements during the day. ? Avoid extreme head movement, like looking up or bending over. Contact a health care provider if:  Your vertigo gets worse.  You have other symptoms,  including: ? Nausea. ? Vomiting. ? Headache. Get help right away if:  You have vision changes.  You have a severe or worsening headache or neck pain.  You cannot stop vomiting.  You have new numbness or weakness in any part of your body. Summary  Vertigo is the feeling that you or your surroundings are moving when they are not.  The Epley maneuver is an exercise that relieves symptoms of vertigo.  If the Epley maneuver is done correctly, it is considered safe. You can do it up to 3 times a day. This information is not intended to replace advice given to you by your health care provider. Make sure you discuss any questions you have with your health care provider. Document Revised: 03/29/2017 Document Reviewed: 03/06/2016 Elsevier Patient Education  2020 Elsevier Inc.   

## 2020-02-15 ENCOUNTER — Other Ambulatory Visit: Payer: Self-pay | Admitting: Obstetrics & Gynecology

## 2020-02-15 DIAGNOSIS — Z1231 Encounter for screening mammogram for malignant neoplasm of breast: Secondary | ICD-10-CM

## 2020-02-16 ENCOUNTER — Other Ambulatory Visit: Payer: Self-pay | Admitting: Radiology

## 2020-02-16 DIAGNOSIS — N644 Mastodynia: Secondary | ICD-10-CM

## 2020-02-16 DIAGNOSIS — N63 Unspecified lump in unspecified breast: Secondary | ICD-10-CM

## 2020-03-04 ENCOUNTER — Other Ambulatory Visit: Payer: 59

## 2020-03-28 ENCOUNTER — Ambulatory Visit: Payer: 59 | Admitting: Family Medicine

## 2020-03-30 ENCOUNTER — Encounter: Payer: Self-pay | Admitting: Family Medicine

## 2020-03-30 ENCOUNTER — Ambulatory Visit: Payer: 59 | Admitting: Family Medicine

## 2020-03-30 ENCOUNTER — Other Ambulatory Visit: Payer: Self-pay

## 2020-03-30 VITALS — BP 119/75 | HR 66 | Ht 64.0 in | Wt 142.0 lb

## 2020-03-30 DIAGNOSIS — L659 Nonscarring hair loss, unspecified: Secondary | ICD-10-CM | POA: Diagnosis not present

## 2020-03-30 LAB — TSH: TSH: 0.9 mIU/L

## 2020-03-30 LAB — VITAMIN D 25 HYDROXY (VIT D DEFICIENCY, FRACTURES): Vit D, 25-Hydroxy: 44 ng/mL (ref 30–100)

## 2020-03-30 LAB — T4, FREE: Free T4: 1.1 ng/dL (ref 0.8–1.8)

## 2020-03-30 LAB — T3, FREE: T3, Free: 3.1 pg/mL (ref 2.3–4.2)

## 2020-03-30 NOTE — Progress Notes (Signed)
Established Patient Office Visit  Subjective:  Patient ID: Kari Stephens, female    DOB: 16-May-1970  Age: 49 y.o. MRN: [id number]  CC:  Chief Complaint  Patient presents with  . Alopecia    HPI [name] presents for some generalized alopecia over several months.  She had Covid last summer and she feels like her hair loss has been more pronounced since then.  She had read about hair loss being possibly related to prior Covid infection.  She does do some hair coloring but has not used any new products recently.  She states her mom had similar problem and apparently had hypothyroidism.  She has not had her thyroid checked in a couple years.  Denies any other overt symptoms of hypothyroidism.  She recently started taking over-the-counter supplements such as zinc and is considering biotin.  Her hair loss has been very generalized and not specific areas.  She had a little bit of mild dryness of the scalp and little bit of flaking.  Past Medical History:  Diagnosis Date  . Anemia   . Asthma with hay fever    rarely uses inhaler  . B12 deficiency   . C. difficile diarrhea    during pregnancy, resolved  . Hemorrhoids   . IBS (irritable bowel syndrome)   . IC (interstitial cystitis)    controlled  . Lipoma   . Seasonal allergies     Past Surgical History:  Procedure Laterality Date  . CESAREAN SECTION     x 2  . DILATION AND CURETTAGE OF UTERUS    . DILITATION & CURRETTAGE/HYSTROSCOPY WITH NOVASURE ABLATION N/A 12/23/2012   Procedure: DILATATION & CURETTAGE/HYSTEROSCOPY WITH NOVASURE ABLATION ;  Surgeon: [name], MD;  Location: [facility];  Service: Gynecology;  Laterality: N/A;  . endometrial polyp removal    . TUBAL LIGATION      Family History  Problem Relation Age of Onset  . Crohn's disease Brother   . Cancer Father        bladder  . Irritable bowel syndrome Brother   . Irregular heart beat Cousin        resolved  . Supraventricular tachycardia Cousin         s/p ablation  . Thyroid disease Mother   . Breast cancer Maternal Aunt   . Colon cancer Neg Hx     Social History   Socioeconomic History  . Marital status: Married    Spouse name: Not on file  . Number of children: Not on file  . Years of education: Not on file  . Highest education level: Not on file  Occupational History  . Not on file  Tobacco Use  . Smoking status: Never Smoker  . Smokeless tobacco: Never Used  Vaping Use  . Vaping Use: Never used  Substance and Sexual Activity  . Alcohol use: Yes    Alcohol/week: 7.0 standard drinks    Types: 7 Glasses of wine per week    Comment: 1 glass of wine most days  . Drug use: No  . Sexual activity: Yes    Birth control/protection: Surgical  Other Topics Concern  . Not on file  Social History Narrative   Lives in [place].     Proficient Engineering geologist.   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 Difficulty of Paying Living Expenses: Not on file  Food Insecurity:   .  Worried About Charity fundraiser in the Last Year: Not on file  . Ran Out of Food in the Last Year: Not on file  Transportation Needs:   . Lack of Transportation (Medical): Not on file  . Lack of Transportation (Non-Medical): Not on file  Physical Activity:   . Days of Exercise per Week: Not on file  . Minutes of Exercise per Session: Not on file  Stress:   . Feeling of Stress : Not on file  Social Connections:   . Frequency of Communication with Friends and Family: Not on file  . Frequency of Social Gatherings with Friends and Family: Not on file  . Attends Religious Services: Not on file  . Active Member of Clubs or Organizations: Not on file  . Attends Archivist Meetings: Not on file  . Marital Status: Not on file  Intimate Partner Violence:   . Fear of Current or Ex-Partner: Not on file  . Emotionally Abused: Not on file  . Physically Abused:  Not on file  . Sexually Abused: Not on file    Outpatient Medications Prior to Visit  Medication Sig Dispense Refill  . Cholecalciferol (D3 PO) Take by mouth.    . Menaquinone-7 (VITAMIN K2 PO) Take by mouth.    . Multiple Vitamins-Minerals (ZINC PO) Take by mouth.    Marland Kitchen albuterol (PROVENTIL HFA;VENTOLIN HFA) 108 (90 Base) MCG/ACT inhaler Inhale 2 puffs into the lungs every 4 (four) hours as needed for wheezing or shortness of breath. 6.7 g 1  . Clotrimazole 1 % OINT Apply pea size amount twice daily for 2 weeks 56 g 0  . meclizine (ANTIVERT) 25 MG tablet Take 1 tablet (25 mg total) by mouth 3 (three) times daily as needed for dizziness. 30 tablet 0  . Probiotic Product (ALIGN PO) Take 1 capsule by mouth daily as needed (uses two weeks every month). For ibs symptoms     No facility-administered medications prior to visit.    Allergies  Allergen Reactions  . Sulfa Antibiotics Nausea And Vomiting    ROS Review of Systems  Constitutional: Negative for chills and fever.  Endocrine: Negative for cold intolerance and heat intolerance.  Skin: Negative for rash.      Objective:    Physical Exam Vitals reviewed.  Constitutional:      Appearance: Normal appearance.  Cardiovascular:     Rate and Rhythm: Normal rate and regular rhythm.  Pulmonary:     Effort: Pulmonary effort is normal.     Comments: She has some faint expiratory wheezes.  No retractions.  No rales. Skin:    Comments: Scalp reveals no specific rash.  She has some very mild thinning mostly anteriorly but no localized alopecia.  No evidence for alopecia areata.  Neurological:     Mental Status: She is alert.     BP 119/75   Pulse 66   Ht 5\' 4"  (1.626 m)   Wt 142 lb (64.4 kg)   BMI 24.37 kg/m  Wt Readings from Last 3 Encounters:  03/30/20 142 lb (64.4 kg)  11/30/19 141 lb 15.6 oz (64.4 kg)  12/30/18 142 lb (64.4 kg)     Health Maintenance Due  Topic Date Due  . Hepatitis C Screening  Never done  . HIV  Screening  Never done  . TETANUS/TDAP  Never done  . PAP SMEAR-Modifier  Never done  . INFLUENZA VACCINE  11/29/2019    There are no preventive care reminders to display for  this patient.  Lab Results  Component Value Date   TSH 1.18 12/30/2018   Lab Results  Component Value Date   WBC 3.7 (L) 11/30/2019   HGB 15.3 (H) 11/30/2019   HCT 44.7 11/30/2019   MCV 88.7 11/30/2019   PLT 156 11/30/2019   Lab Results  Component Value Date   NA 136 11/30/2019   K 3.8 11/30/2019   CO2 24 11/30/2019   GLUCOSE 113 (H) 11/30/2019   BUN 6 11/30/2019   CREATININE 0.90 11/30/2019   BILITOT 0.9 11/30/2019   ALKPHOS 63 11/30/2019   AST 22 11/30/2019   ALT 16 11/30/2019   PROT 6.2 (L) 11/30/2019   ALBUMIN 4.1 11/30/2019   CALCIUM 8.9 11/30/2019   ANIONGAP 10 11/30/2019   GFR 80.70 11/29/2014   No results found for: CHOL No results found for: HDL No results found for: LDLCALC No results found for: TRIG No results found for: CHOLHDL No results found for: HGBA1C    Assessment & Plan:   Problem List Items Addressed This Visit    None    Visit Diagnoses    Alopecia    -  Primary   Relevant Orders   TSH   T4, Free   T3, Free   VITAMIN D 25 Hydroxy (Vit-D Deficiency, Fractures)    Patient has what appears to be more generalized mild alopecia.  This may be genetic.  She has questions whether this could be related to previous Covid infection.  -Check thyroid functions and vitamin D level.  If normal, consider trial of over-the-counter minoxidil topically as a first-line.  No orders of the defined types were placed in this encounter.   Follow-up: No follow-ups on file.    Carolann Littler, MD

## 2020-03-30 NOTE — Patient Instructions (Signed)
If blood tests all normal, consider trial of over the counter topical Minoxidil.

## 2020-03-31 ENCOUNTER — Encounter: Payer: Self-pay | Admitting: Family Medicine

## 2020-03-31 NOTE — Progress Notes (Signed)
Thyroid and Vit D levels are normal.   Consider OTC topical Minoxidil for hair loss.

## 2020-04-07 ENCOUNTER — Telehealth: Payer: Self-pay | Admitting: Family Medicine

## 2020-04-07 NOTE — Telephone Encounter (Signed)
Pt call and stated she want a call to talk about her labs.

## 2020-04-08 NOTE — Telephone Encounter (Signed)
Spoke with pt and informed her of the lab results and pt voiced understanding

## 2020-04-13 ENCOUNTER — Ambulatory Visit
Admission: RE | Admit: 2020-04-13 | Discharge: 2020-04-13 | Disposition: A | Discharge status: Home / Self Care | Payer: 59 | Source: Ambulatory Visit | Attending: Obstetrics & Gynecology | Admitting: Obstetrics & Gynecology

## 2020-04-13 ENCOUNTER — Other Ambulatory Visit: Payer: Self-pay

## 2020-04-13 DIAGNOSIS — Z1231 Encounter for screening mammogram for malignant neoplasm of breast: Secondary | ICD-10-CM

## 2020-08-15 ENCOUNTER — Telehealth: Payer: Self-pay | Admitting: Family Medicine

## 2020-08-15 NOTE — Telephone Encounter (Signed)
Patient states that she is out of town with family. States she has got pink eye from them and would like Dr. Elease Hashimoto to call in a prescription to [facility].  Please advise.

## 2020-08-16 MED ORDER — POLYMYXIN B-TRIMETHOPRIM 10000-0.1 UNIT/ML-% OP SOLN: OPHTHALMIC | 0 refills | Status: AC

## 2020-08-16 NOTE — Telephone Encounter (Signed)
Patient called back and was offered a virtual but declined and didn't think it was necessary. Pt wanted to see of prescription be sent to   [facility] at [place], [place]    [phone]   CB is [phone]

## 2020-08-16 NOTE — Telephone Encounter (Signed)
Rx sent in.  Spoke with the patient. She is aware her medication has been sent in and to follow up with Dr. Elease Hashimoto if symptoms persist.

## 2020-08-16 NOTE — Telephone Encounter (Signed)
Okay to go ahead and send Polytrim ophthalmic drops 2 drops into affected eye every 4 hours while awake.  Set up follow-up as soon as she gets back if symptoms not fully clearing over the next few days

## 2021-03-13 ENCOUNTER — Other Ambulatory Visit: Payer: Self-pay | Admitting: Obstetrics & Gynecology

## 2021-03-13 DIAGNOSIS — Z1231 Encounter for screening mammogram for malignant neoplasm of breast: Secondary | ICD-10-CM

## 2021-04-19 ENCOUNTER — Ambulatory Visit
Admission: RE | Admit: 2021-04-19 | Discharge: 2021-04-19 | Disposition: A | Discharge status: Home / Self Care | Payer: 59 | Source: Ambulatory Visit | Attending: Obstetrics & Gynecology | Admitting: Obstetrics & Gynecology

## 2021-04-19 DIAGNOSIS — Z1231 Encounter for screening mammogram for malignant neoplasm of breast: Secondary | ICD-10-CM

## 2021-09-06 IMAGING — MG DIGITAL SCREENING BILAT W/ TOMO W/ CAD
8 series · 9 of 24 positions shown · non-contrast
Comparison: Previous exam(s).

CLINICAL DATA: Screening.

EXAM:
DIGITAL SCREENING BILATERAL MAMMOGRAM WITH TOMO AND CAD

[R MLO synth-2D]
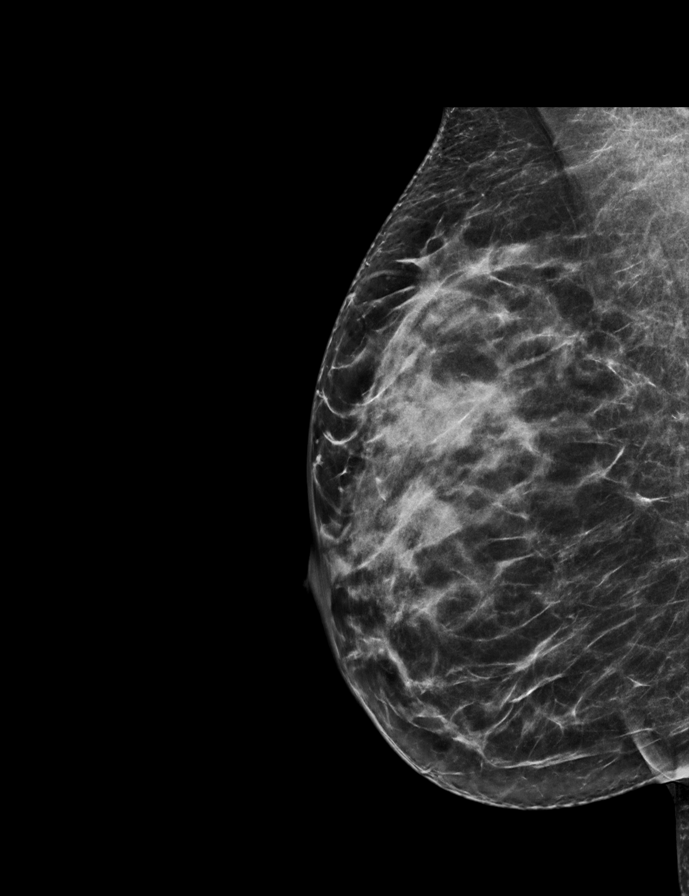

[R CC synth-2D]
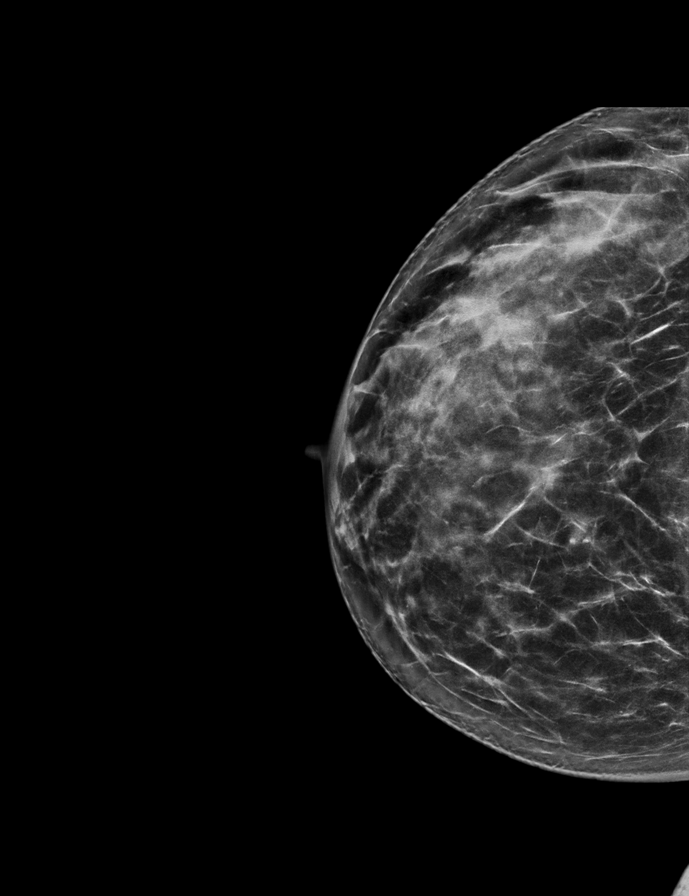

[L CC synth-2D]
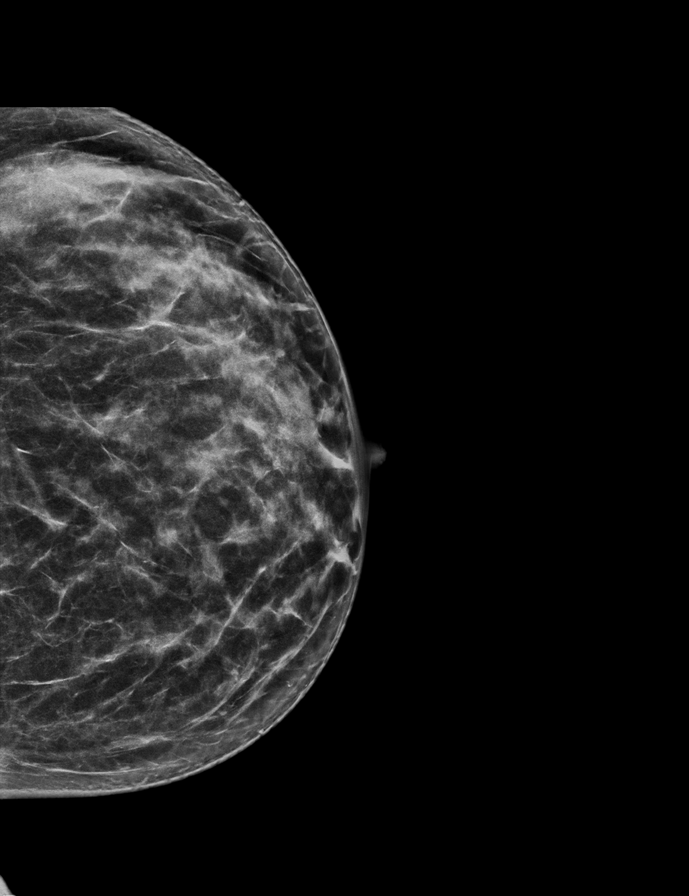

[L MLO synth-2D]
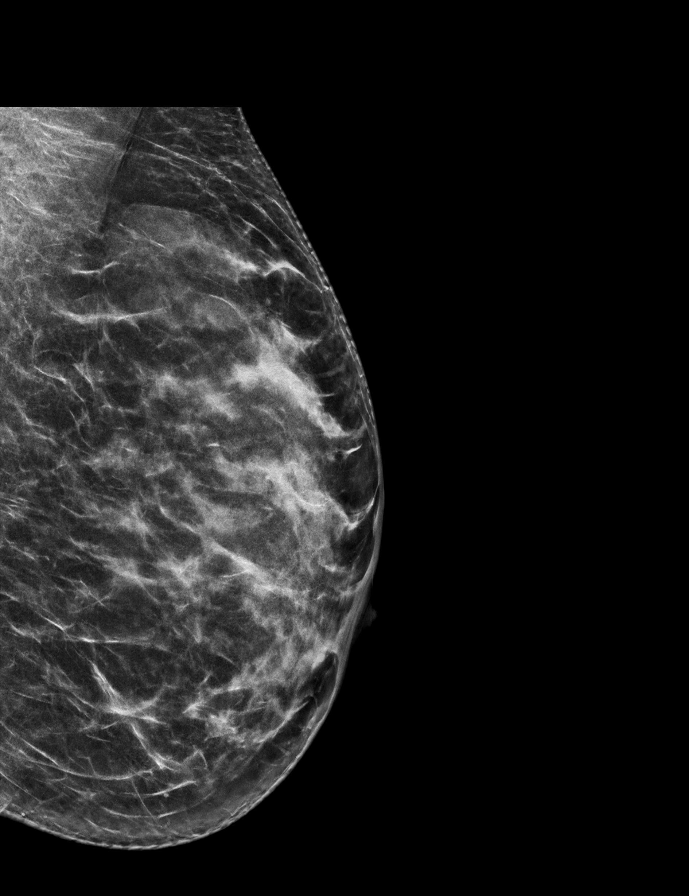

[R MLO tomo · 2 of 65 frames shown]
[frame 21/65]
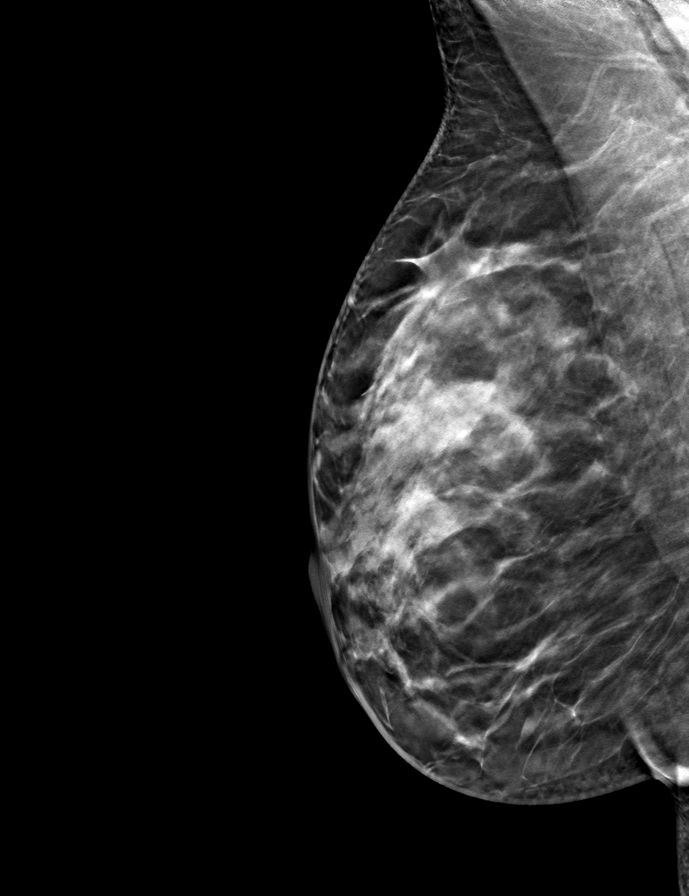
[frame 33/65]
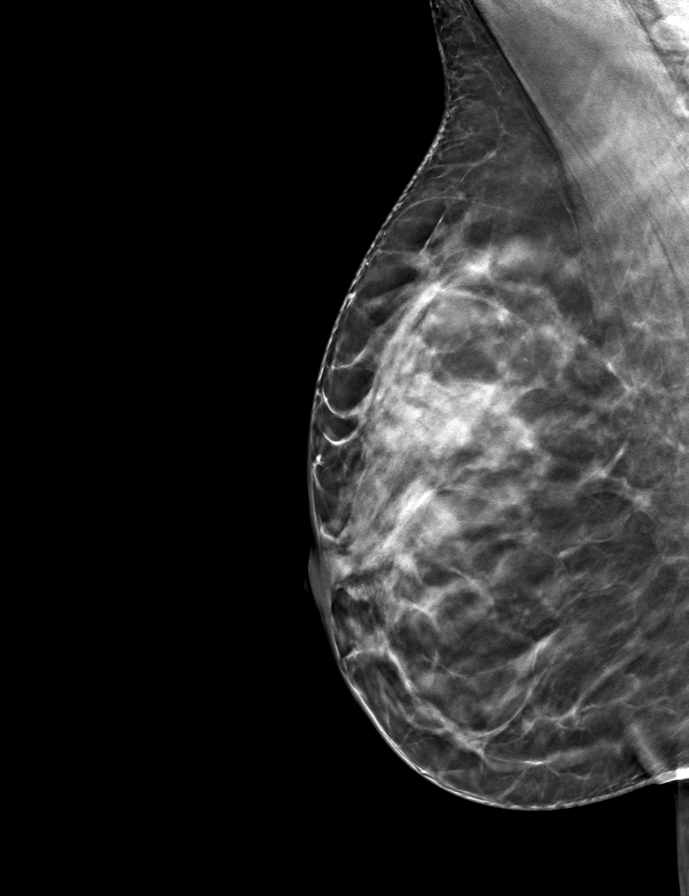

[R CC tomo · tomo slice 35/68.0]
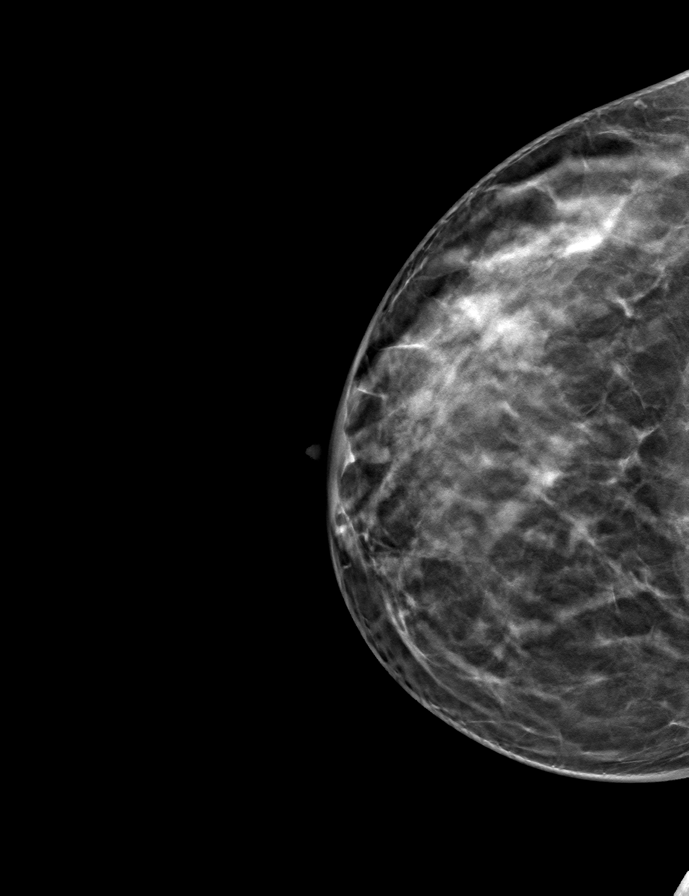

[L CC tomo · tomo slice 31/61.0]
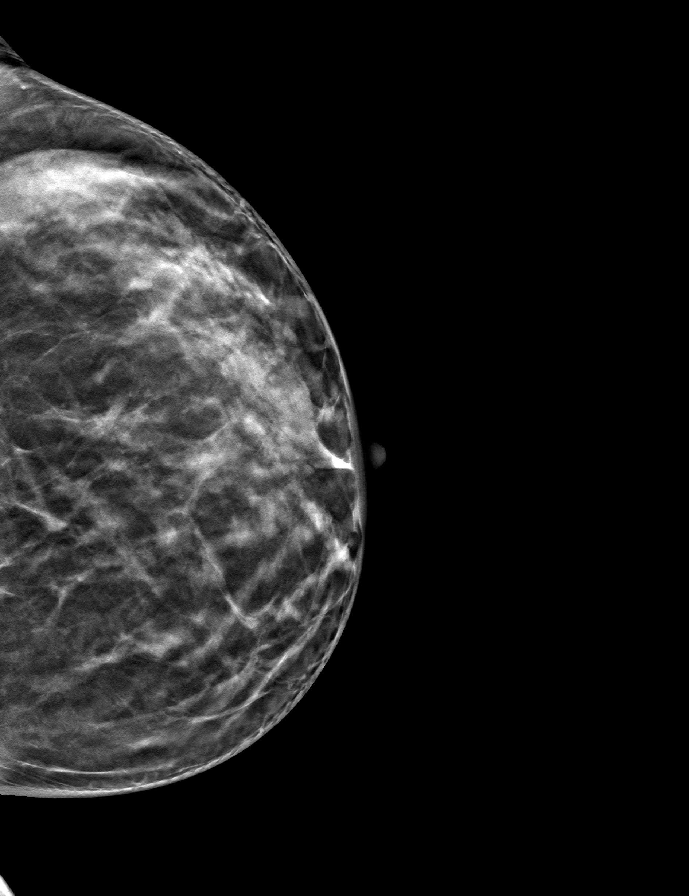

[L MLO tomo · tomo slice 31/62.0]
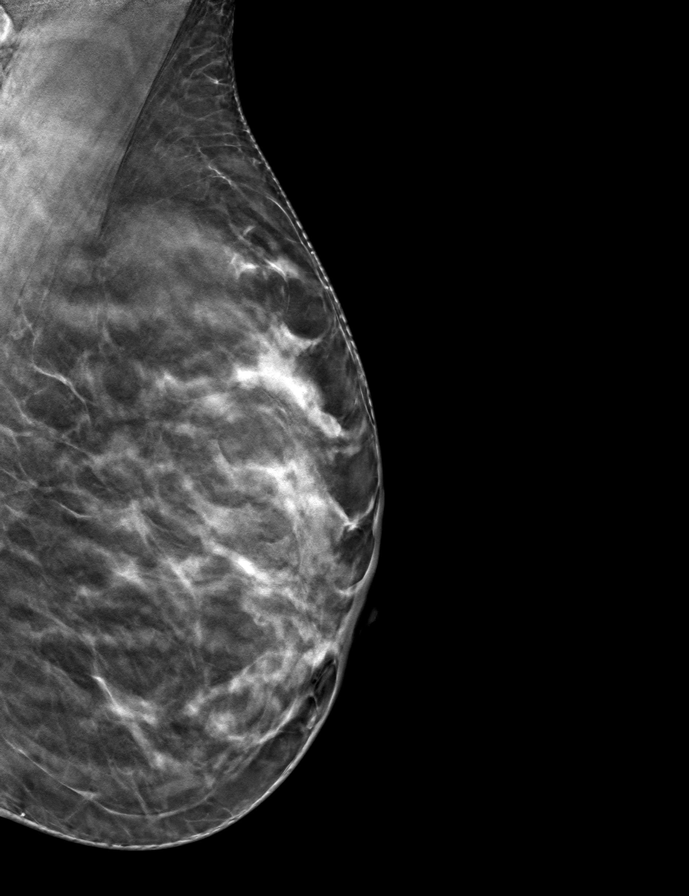

[9 of 24 positions shown; findings below may reference images not displayed]

ACR Breast Density Category c: The breast tissue is heterogeneously
dense, which may obscure small masses.
FINDINGS: There are no findings suspicious for malignancy. Images were
processed with CAD.
IMPRESSION: No mammographic evidence of malignancy. A result letter of this
screening mammogram will be mailed directly to the patient.

RECOMMENDATION:
Screening mammogram in one year. (Code:[id number])

BI-RADS CATEGORY  1: Negative.

## 2022-01-05 ENCOUNTER — Telehealth: Payer: Self-pay | Admitting: Family Medicine

## 2022-01-05 NOTE — Telephone Encounter (Addendum)
I spoke with the patient and provided information regarding vaccines needed for employment opportunity

## 2022-01-05 NOTE — Telephone Encounter (Signed)
Left a message for the pt to return my call.  

## 2022-01-05 NOTE — Telephone Encounter (Signed)
Pt requests to speak with a nurse regarding the need for certain immunizations

## 2022-01-08 ENCOUNTER — Telehealth: Payer: Self-pay | Admitting: Family Medicine

## 2022-01-08 NOTE — Telephone Encounter (Signed)
Pt called requesting the following: TB test, Positive Titters, TDap   Pt was scheduled for an OV on 01/10/22.  Please advise.

## 2022-01-08 NOTE — Telephone Encounter (Signed)
Noted Left a message informing patient that labs will be discussed during upcoming OV

## 2022-01-10 ENCOUNTER — Ambulatory Visit (INDEPENDENT_AMBULATORY_CARE_PROVIDER_SITE_OTHER): Payer: 59 | Admitting: Family Medicine

## 2022-01-10 ENCOUNTER — Encounter: Payer: Self-pay | Admitting: Family Medicine

## 2022-01-10 VITALS — BP 100/79 | HR 75 | Temp 97.9°F | Resp 18 | Ht 64.0 in | Wt 150.8 lb

## 2022-01-10 DIAGNOSIS — Z0184 Encounter for antibody response examination: Secondary | ICD-10-CM

## 2022-01-10 MED ORDER — TETANUS-DIPHTH-ACELL PERTUSSIS 5-2.5-18.5 LF-MCG/0.5 IM SUSY
0.5000 mL | PREFILLED_SYRINGE | Freq: Once | INTRAMUSCULAR | Status: AC
  Administered 2022-01-10: 0.5 mL via INTRAMUSCULAR

## 2022-01-10 NOTE — Progress Notes (Signed)
a 

## 2022-01-10 NOTE — Progress Notes (Signed)
Established Patient Office Visit  Subjective   Patient ID: Kari Stephens, female    DOB: April 01, 1971  Age: 51 y.o. MRN: [id number]  Chief Complaint  Patient presents with   Immunizations    Needs immunizations for work     HPI   Zenya had called requesting several items.  She is looking at new employment through [employer].  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  She cannot confirm prior immunization with MMR or varicella.  Generally very healthy.  No risk factors for TB.  Past Medical History:  Diagnosis Date   Anemia    Asthma with hay fever    rarely uses inhaler   B12 deficiency    C. difficile diarrhea    during pregnancy, resolved   Hemorrhoids    IBS (irritable bowel syndrome)    IC (interstitial cystitis)    controlled   Lipoma    Seasonal allergies    Past Surgical History:  Procedure Laterality Date   CESAREAN SECTION     x 2   DILATION AND CURETTAGE OF UTERUS     DILITATION & CURRETTAGE/HYSTROSCOPY WITH NOVASURE ABLATION N/A 12/23/2012   Procedure: DILATATION & CURETTAGE/HYSTEROSCOPY WITH NOVASURE ABLATION ;  Surgeon: [name], MD;  Location: [facility];  Service: Gynecology;  Laterality: N/A;   endometrial polyp removal     TUBAL LIGATION      reports that she has never smoked. She has never used smokeless tobacco. She reports current alcohol use of about 7.0 standard drinks of alcohol per week. She reports that she does not use drugs. family history includes Breast cancer in her maternal aunt; Cancer in her father; Crohn's disease in her brother; Irregular heart beat in her cousin; Irritable bowel syndrome in her brother; Supraventricular  tachycardia in her cousin; Thyroid disease in her mother. Allergies  Allergen Reactions   Sulfa Antibiotics Nausea And Vomiting    Review of Systems  Constitutional:  Negative for chills and fever.  Respiratory:  Negative for cough and shortness of breath.   Skin:  Negative for rash.      Objective:     BP 100/79 (BP Location: Right Arm, Patient Position: Sitting, Cuff Size: Normal)   Pulse 75   Temp 97.9 F (36.6 C) (Oral)   Resp 18   Ht '5\' 4"'  (1.626 m)   Wt 150 lb 12.8 oz (68.4 kg)   LMP 02/08/2020   SpO2 98%   BMI 25.88 kg/m  BP Readings from Last 3 Encounters:  01/10/22 100/79  03/30/20 119/75  12/09/19 90/60   Wt Readings from Last 3 Encounters:  01/10/22 150 lb 12.8 oz (68.4 kg)  03/30/20 142 lb (64.4 kg)  11/30/19 141 lb 15.6 oz (64.4 kg)      Physical Exam Vitals reviewed.  Constitutional:      Appearance: Normal appearance.  Cardiovascular:     Rate and Rhythm: Normal rate and regular rhythm.  Pulmonary:     Effort: Pulmonary effort is normal.     Breath sounds: Normal breath sounds. No wheezing or rales.  Neurological:     Mental Status: She is alert.      No results found for  any visits on 01/10/22.    The ASCVD Risk score (Arnett DK, et al., 2019) failed to calculate for the following reasons:   Cannot find a previous HDL lab   Cannot find a previous total cholesterol lab    Assessment & Plan:   Problem List Items Addressed This Visit   None Visit Diagnoses     Immunity status testing    -  Primary   Relevant Medications   Tdap (BOOSTRIX) injection 0.5 mL (Completed)   Other Relevant Orders   Varicella Zoster Antibody, IgG   Measles/Mumps/Rubella Immunity   QuantiFERON-TB Gold Plus     -Checking labs as above. -Tdap booster given -She is undecided regarding flu vaccine but could get at local pharmacy  No follow-ups on file.    Carolann Littler, MD

## 2022-01-13 LAB — QUANTIFERON-TB GOLD PLUS
Mitogen-NIL: >10 IU/mL
NIL: 0.04 IU/mL
QuantiFERON-TB Gold Plus: NEGATIVE
TB1-NIL: 0 IU/mL
TB2-NIL: <0 IU/mL

## 2022-01-13 LAB — MEASLES/MUMPS/RUBELLA IMMUNITY
Mumps IgG: 11.9 AU/mL
Rubella: 6.65 Index
Rubeola IgG: 46.2 AU/mL

## 2022-01-13 LAB — VARICELLA ZOSTER ANTIBODY, IGG: Varicella IgG: 897.4 index

## 2022-01-15 ENCOUNTER — Telehealth: Payer: Self-pay | Admitting: Family Medicine

## 2022-01-15 NOTE — Telephone Encounter (Signed)
Please see result note 

## 2022-01-15 NOTE — Telephone Encounter (Signed)
Pt requesting a call to discuss recent lab results

## 2022-03-21 ENCOUNTER — Other Ambulatory Visit: Payer: Self-pay | Admitting: Obstetrics & Gynecology

## 2022-03-21 DIAGNOSIS — Z1231 Encounter for screening mammogram for malignant neoplasm of breast: Secondary | ICD-10-CM

## 2022-05-21 ENCOUNTER — Ambulatory Visit: Payer: 59

## 2022-05-25 ENCOUNTER — Ambulatory Visit: Payer: 59

## 2022-06-14 ENCOUNTER — Ambulatory Visit
Admission: RE | Admit: 2022-06-14 | Discharge: 2022-06-14 | Disposition: A | Discharge status: Home / Self Care | Payer: 59 | Source: Ambulatory Visit | Attending: Obstetrics & Gynecology | Admitting: Obstetrics & Gynecology

## 2022-06-14 DIAGNOSIS — Z1231 Encounter for screening mammogram for malignant neoplasm of breast: Secondary | ICD-10-CM

## 2022-07-20 ENCOUNTER — Encounter: Payer: Self-pay | Admitting: Gastroenterology

## 2022-07-23 DIAGNOSIS — M2550 Pain in unspecified joint: Secondary | ICD-10-CM | POA: Insufficient documentation

## 2022-07-23 DIAGNOSIS — K6289 Other specified diseases of anus and rectum: Secondary | ICD-10-CM | POA: Insufficient documentation

## 2022-07-23 DIAGNOSIS — L409 Psoriasis, unspecified: Secondary | ICD-10-CM | POA: Insufficient documentation

## 2022-07-23 DIAGNOSIS — J309 Allergic rhinitis, unspecified: Secondary | ICD-10-CM | POA: Insufficient documentation

## 2022-07-23 DIAGNOSIS — L509 Urticaria, unspecified: Secondary | ICD-10-CM | POA: Insufficient documentation

## 2022-08-06 ENCOUNTER — Ambulatory Visit (AMBULATORY_SURGERY_CENTER): Payer: 59

## 2022-08-06 VITALS — Ht 64.0 in | Wt 143.0 lb

## 2022-08-06 DIAGNOSIS — Z1211 Encounter for screening for malignant neoplasm of colon: Secondary | ICD-10-CM

## 2022-08-06 MED ORDER — NA SULFATE-K SULFATE-MG SULF 17.5-3.13-1.6 GM/177ML PO SOLN
1.0000 | Freq: Once | ORAL | 0 refills | Status: AC
Start: 2022-08-06 — End: 2022-08-06

## 2022-08-06 NOTE — Progress Notes (Signed)
No egg or soy allergy known to patient  No issues known to pt with past sedation with any surgeries or procedures Patient denies ever being told they had issues or difficulty with intubation  No FH of Malignant Hyperthermia Pt is not on diet pills Pt is not on  home 02  Pt is not on blood thinners  Pt denies issues with constipation  Hx of A flutter during pregnancy  Have any cardiac testing pending--no  Pt instructed to use Singlecare.com or GoodRx for a price reduction on prep   Patient's chart reviewed by Cathlyn Parsons CNRA prior to previsit and patient appropriate for the [facility].  Previsit completed and red dot placed by patient's name on their procedure day (on provider's schedule).

## 2022-08-27 ENCOUNTER — Ambulatory Visit (AMBULATORY_SURGERY_CENTER): Payer: 59 | Admitting: Gastroenterology

## 2022-08-27 ENCOUNTER — Encounter: Payer: Self-pay | Admitting: Gastroenterology

## 2022-08-27 VITALS — BP 112/79 | HR 71 | Temp 98.4°F | Resp 12 | Ht 64.0 in | Wt 143.0 lb

## 2022-08-27 DIAGNOSIS — Z1211 Encounter for screening for malignant neoplasm of colon: Secondary | ICD-10-CM

## 2022-08-27 MED ORDER — SODIUM CHLORIDE 0.9 % IV SOLN
500.0000 mL | Freq: Once | INTRAVENOUS | Status: DC
Start: 2022-08-27 — End: 2022-08-27

## 2022-08-27 NOTE — Op Note (Signed)
[facility] Patient Name: Kobe Jansma Procedure Date: 08/27/2022 7:13 AM MRN: [id number] Endoscopist: Napoleon Form , MD, [id number] Age: 52 Referring MD:  Date of Birth: 04-11-1971 Gender: Female Account #: [id number] Procedure:                Colonoscopy Indications:              Screening for colorectal malignant neoplasm Medicines:                Monitored Anesthesia Care Procedure:                Pre-Anesthesia Assessment:                           - Prior to the procedure, a History and Physical                            was performed, and patient medications and                            allergies were reviewed. The patient's tolerance of                            previous anesthesia was also reviewed. The risks                            and benefits of the procedure and the sedation                            options and risks were discussed with the patient.                            All questions were answered, and informed consent                            was obtained. Prior Anticoagulants: The patient has                            taken no anticoagulant or antiplatelet agents. ASA                            Grade Assessment: II - A patient with mild systemic                            disease. After reviewing the risks and benefits,                            the patient was deemed in satisfactory condition to                            undergo the procedure.                           After obtaining informed consent, the colonoscope  was passed under direct vision. Throughout the                            procedure, the patient's blood pressure, pulse, and                            oxygen saturations were monitored continuously. The                            Olympus PCF-H190DL ([id number]) Colonoscope was                            introduced through the anus and advanced to the the                            cecum,  identified by appendiceal orifice and                            ileocecal valve. The colonoscopy was performed                            without difficulty. The patient tolerated the                            procedure well. The quality of the bowel                            preparation was excellent. The ileocecal valve,                            appendiceal orifice, and rectum were photographed. Scope In: 8:05:57 AM Scope Out: 8:20:43 AM Scope Withdrawal Time: 0 hours 8 minutes 19 seconds  Total Procedure Duration: 0 hours 14 minutes 46 seconds  Findings:                 The perianal and digital rectal examinations were                            normal.                           A less than 5 mm post banding scar was found in the                            distal rectum. The scar tissue was healthy in                            appearance.                           The exam was otherwise without abnormality. Complications:            No immediate complications. Estimated Blood Loss:     Estimated blood loss: none. Impression:               - The entire examined  colon is normal.                           - Scar in the distal rectum.                           - The examination was otherwise normal.                           - No specimens collected.                           - The GI Genius (intelligent endoscopy module),                            computer-aided polyp detection system powered by AI                            was utilized to detect colorectal polyps through                            enhanced visualization during colonoscopy. Recommendation:           - Patient has a contact number available for                            emergencies. The signs and symptoms of potential                            delayed complications were discussed with the                            patient. Return to normal activities tomorrow.                            Written discharge  instructions were provided to the                            patient.                           - Resume previous diet.                           - Continue present medications.                           - Repeat colonoscopy in 10 years for surveillance. Napoleon Form, MD 08/27/2022 8:29:32 AM This report has been signed electronically.

## 2022-08-27 NOTE — Progress Notes (Signed)
[facility] History and Physical   Primary Care Physician:  Kristian Covey, MD   Reason for Procedure:  Colorectal cancer screening  Plan:    Screening colonoscopy with possible interventions as needed     HPI: Kari Stephens is a very pleasant 52 y.o. female here for screening colonoscopy. Denies any nausea, vomiting, abdominal pain, melena or bright red blood per rectum  The risks and benefits as well as alternatives of endoscopic procedure(s) have been discussed and reviewed. All questions answered. The patient agrees to proceed.    Past Medical History:  Diagnosis Date   Anemia    Asthma with hay fever    rarely uses inhaler   B12 deficiency    C. difficile diarrhea    during pregnancy, resolved   Hemorrhoids    IBS (irritable bowel syndrome)    IC (interstitial cystitis)    controlled   Lipoma    Seasonal allergies     Past Surgical History:  Procedure Laterality Date   CESAREAN SECTION     x 2   DILATION AND CURETTAGE OF UTERUS     DILITATION & CURRETTAGE/HYSTROSCOPY WITH NOVASURE ABLATION N/A 12/23/2012   Procedure: DILATATION & CURETTAGE/HYSTEROSCOPY WITH NOVASURE ABLATION ;  Surgeon: Serita Kyle, MD;  Location: [facility];  Service: Gynecology;  Laterality: N/A;   endometrial polyp removal     TUBAL LIGATION      Prior to Admission medications   Medication Sig Start Date End Date Taking? Authorizing Provider  cetirizine (ZYRTEC) 10 MG tablet Take 10 mg by mouth. 12/07/19  Yes [provider]  Cholecalciferol (D3 PO) Take by mouth.   Yes [provider]  Menaquinone-7 (VITAMIN K2 PO) Take by mouth.   Yes [provider]  NP THYROID 15 MG tablet Take 15 mg by mouth daily.   Yes [provider]  NP THYROID 30 MG tablet Take 30 mg by mouth daily before breakfast.   Yes [provider]  Nutritional Supplements (JUICE PLUS FIBRE PO) Juice Plus   Yes [provider]  progesterone  (PROMETRIUM) 100 MG capsule Take by mouth daily.   Yes [provider]  albuterol (PROVENTIL HFA;VENTOLIN HFA) 108 (90 Base) MCG/ACT inhaler Inhale 2 puffs into the lungs every 4 (four) hours as needed for wheezing or shortness of breath. 03/10/18   Burchette, Elberta Fortis, MD  Clotrimazole 1 % OINT Apply pea size amount twice daily for 2 weeks Patient not taking: Reported on 01/10/2022 12/30/18   Napoleon Form, MD  estradiol (VIVELLE-DOT) 0.025 MG/24HR Place 1 patch onto the skin 2 (two) times a week.    [provider]  meclizine (ANTIVERT) 25 MG tablet Take 1 tablet (25 mg total) by mouth 3 (three) times daily as needed for dizziness. Patient not taking: Reported on 01/10/2022 12/08/19   Kristian Covey, MD  Multiple Vitamins-Minerals (ZINC PO) Take by mouth.    [provider]  PRESCRIPTION MEDICATION TESTOSTERONE 2% CREAM APPLY 1-2 CLICKS (1/4-1/2GRAM) BEHIND KNEES, ALTERNATING KNEES.    [provider]  Probiotic Product (ALIGN PO) Take 1 capsule by mouth daily as needed (uses two weeks every month). For ibs symptoms    [provider]  trimethoprim-polymyxin b (POLYTRIM) ophthalmic solution Placed 2 drops in the affected eye once ever 4 hours while awake. Patient not taking: Reported on 01/10/2022 08/16/20   Kristian Covey, MD    Current Outpatient Medications  Medication Sig Dispense Refill   cetirizine (ZYRTEC)  10 MG tablet Take 10 mg by mouth.     Cholecalciferol (D3 PO) Take by mouth.     Menaquinone-7 (VITAMIN K2 PO) Take by mouth.     NP THYROID 15 MG tablet Take 15 mg by mouth daily.     NP THYROID 30 MG tablet Take 30 mg by mouth daily before breakfast.     Nutritional Supplements (JUICE PLUS FIBRE PO) Juice Plus     progesterone (PROMETRIUM) 100 MG capsule Take by mouth daily.     albuterol (PROVENTIL HFA;VENTOLIN HFA) 108 (90 Base) MCG/ACT inhaler Inhale 2 puffs into the lungs every 4 (four) hours as needed for wheezing or  shortness of breath. 6.7 g 1   Clotrimazole 1 % OINT Apply pea size amount twice daily for 2 weeks (Patient not taking: Reported on 01/10/2022) 56 g 0   estradiol (VIVELLE-DOT) 0.025 MG/24HR Place 1 patch onto the skin 2 (two) times a week.     meclizine (ANTIVERT) 25 MG tablet Take 1 tablet (25 mg total) by mouth 3 (three) times daily as needed for dizziness. (Patient not taking: Reported on 01/10/2022) 30 tablet 0   Multiple Vitamins-Minerals (ZINC PO) Take by mouth.     PRESCRIPTION MEDICATION TESTOSTERONE 2% CREAM APPLY 1-2 CLICKS (1/4-1/2GRAM) BEHIND KNEES, ALTERNATING KNEES.     Probiotic Product (ALIGN PO) Take 1 capsule by mouth daily as needed (uses two weeks every month). For ibs symptoms     trimethoprim-polymyxin b (POLYTRIM) ophthalmic solution Placed 2 drops in the affected eye once ever 4 hours while awake. (Patient not taking: Reported on 01/10/2022) 10 mL 0   Current Facility-Administered Medications  Medication Dose Route Frequency Provider Last Rate Last Admin   0.9 %  sodium chloride infusion  500 mL Intravenous Once Napoleon Form, MD        Allergies as of 08/27/2022 - Review Complete 08/27/2022  Allergen Reaction Noted   Sulfa antibiotics Nausea And Vomiting 10/12/2011    Family History  Problem Relation Age of Onset   Crohn's disease Brother    Cancer Father        bladder   Irritable bowel syndrome Brother    Irregular heart beat Cousin        resolved   Supraventricular tachycardia Cousin        s/p ablation   Thyroid disease Mother    Breast cancer Maternal Aunt    Colon cancer Neg Hx     Social History   Socioeconomic History   Marital status: Married    Spouse name: Not on file   Number of children: Not on file   Years of education: Not on file   Highest education level: Not on file  Occupational History   Not on file  Tobacco Use   Smoking status: Never   Smokeless tobacco: Never  Vaping Use   Vaping Use: Never used  Substance and  Sexual Activity   Alcohol use: Yes    Alcohol/week: 7.0 standard drinks of alcohol    Types: 7 Glasses of wine per week    Comment: 1 glass of wine most days   Drug use: No   Sexual activity: Yes    Birth control/protection: Surgical  Other Topics Concern   Not on file  Social History Narrative   Lives in [place].     Proficient Surveyor, mining.   Lives with spouse and 2 children (ages 50 and 36)   Social Determinants of Corporate investment banker  Strain: Not on file  Food Insecurity: Not on file  Transportation Needs: Not on file  Physical Activity: Not on file  Stress: Not on file  Social Connections: Not on file  Intimate Partner Violence: Not on file    Review of Systems:  All other review of systems negative except as mentioned in the HPI.  Physical Exam: Vital signs in last 24 hours: Blood Pressure 128/86   Pulse 70   Temperature 98.4 F (36.9 C)   Height 5\' 4"  (1.626 m)   Weight 143 lb (64.9 kg)   Last Menstrual Period 02/08/2020   Oxygen Saturation 99%   Body Mass Index 24.55 kg/m  General:   Alert, NAD Lungs:  Clear .   Heart:  Regular rate and rhythm Abdomen:  Soft, nontender and nondistended. Neuro/Psych:  Alert and cooperative. Normal mood and affect. A and O x 3  Reviewed labs, radiology imaging, old records and pertinent past GI work up  Patient is appropriate for planned procedure(s) and anesthesia in an ambulatory setting   K. Scherry Ran , MD [phone]

## 2022-08-27 NOTE — Progress Notes (Signed)
Pt's states no medical or surgical changes since previsit or office visit. 

## 2022-08-27 NOTE — Progress Notes (Signed)
Report to PACU, RN, vss, BBS= Clear.  

## 2022-08-27 NOTE — Patient Instructions (Signed)
Thank you for coming in to see Kari Stephens today! Resume your diet and medications today, Return to regular daily activities tomorrow. Screening colonoscopy is recommended again in 10 years.    YOU HAD AN ENDOSCOPIC PROCEDURE TODAY AT THE [facility]:   Refer to the procedure report that was given to you for any specific questions about what was found during the examination.  If the procedure report does not answer your questions, please call your gastroenterologist to clarify.  If you requested that your care partner not be given the details of your procedure findings, then the procedure report has been included in a sealed envelope for you to review at your convenience later.  YOU SHOULD EXPECT: Some feelings of bloating in the abdomen. Passage of more gas than usual.  Walking can help get rid of the air that was put into your GI tract during the procedure and reduce the bloating. If you had a lower endoscopy (such as a colonoscopy or flexible sigmoidoscopy) you may notice spotting of blood in your stool or on the toilet paper. If you underwent a bowel prep for your procedure, you may not have a normal bowel movement for a few days.  Please Note:  You might notice some irritation and congestion in your nose or some drainage.  This is from the oxygen used during your procedure.  There is no need for concern and it should clear up in a day or so.  SYMPTOMS TO REPORT IMMEDIATELY:  Following lower endoscopy (colonoscopy or flexible sigmoidoscopy):  Excessive amounts of blood in the stool  Significant tenderness or worsening of abdominal pains  Swelling of the abdomen that is new, acute  Fever of 100F or higher    For urgent or emergent issues, a gastroenterologist can be reached at any hour by calling [phone]. Do not use MyChart messaging for urgent concerns.    DIET:  We do recommend a small meal at first, but then you may proceed to your regular diet.  Drink plenty of fluids  but you should avoid alcoholic beverages for 24 hours.  ACTIVITY:  You should plan to take it easy for the rest of today and you should NOT DRIVE or use heavy machinery until tomorrow (because of the sedation medicines used during the test).    FOLLOW UP: Our staff will call the number listed on your records the next business day following your procedure.  We will call around 7:15- 8:00 am to check on you and address any questions or concerns that you may have regarding the information given to you following your procedure. If we do not reach you, we will leave a message.     If any biopsies were taken you will be contacted by phone or by letter within the next 1-3 weeks.  Please call Kari Stephens at [phone] if you have not heard about the biopsies in 3 weeks.    SIGNATURES/CONFIDENTIALITY: You and/or your care partner have signed paperwork which will be entered into your electronic medical record.  These signatures attest to the fact that that the information above on your After Visit Summary has been reviewed and is understood.  Full responsibility of the confidentiality of this discharge information lies with you and/or your care-partner.

## 2022-08-28 ENCOUNTER — Telehealth: Payer: Self-pay | Admitting: *Deleted

## 2022-08-28 NOTE — Telephone Encounter (Signed)
  Follow up Call-     08/27/2022    7:22 AM  Call back number  Post procedure Call Back phone  # [phone]  Permission to leave phone message Yes     Patient questions:  Do you have a fever, pain , or abdominal swelling? No. Pain Score  0 *  Have you tolerated food without any problems? Yes.    Have you been able to return to your normal activities? Yes.    Do you have any questions about your discharge instructions: Diet   No. Medications  No. Follow up visit  No.  Do you have questions or concerns about your Care? No.  Actions: * If pain score is 4 or above: No action needed, pain <4.

## 2023-02-28 ENCOUNTER — Telehealth: Payer: Self-pay | Admitting: Family Medicine

## 2023-02-28 NOTE — Telephone Encounter (Signed)
Pt mother is calling and her daughter will be 18 soon and I was going to give her the list of provider here excepting new patients however she would like dr burchette recommendation

## 2023-03-01 NOTE — Telephone Encounter (Signed)
Patient informed of the message below and voiced understanding  

## 2023-05-17 ENCOUNTER — Other Ambulatory Visit: Payer: Self-pay | Admitting: Obstetrics & Gynecology

## 2023-05-17 DIAGNOSIS — Z1231 Encounter for screening mammogram for malignant neoplasm of breast: Secondary | ICD-10-CM

## 2023-06-18 ENCOUNTER — Ambulatory Visit
Admission: RE | Admit: 2023-06-18 | Discharge: 2023-06-18 | Disposition: A | Discharge status: Home / Self Care | Payer: 59 | Source: Ambulatory Visit | Attending: Obstetrics & Gynecology | Admitting: Obstetrics & Gynecology

## 2023-06-18 DIAGNOSIS — Z1231 Encounter for screening mammogram for malignant neoplasm of breast: Secondary | ICD-10-CM

## 2024-05-22 ENCOUNTER — Other Ambulatory Visit: Payer: Self-pay | Admitting: Obstetrics & Gynecology

## 2024-05-22 DIAGNOSIS — Z1231 Encounter for screening mammogram for malignant neoplasm of breast: Secondary | ICD-10-CM

## 2024-06-22 ENCOUNTER — Ambulatory Visit
